# Patient Record
Sex: Female | Born: 1980 | Race: Black or African American | Hispanic: No | Marital: Single | Smoking: Current some day smoker
Health system: Southern US, Community
[De-identification: ages and names within clinical notes are randomized; demographics above are authoritative.]

## PROBLEM LIST (undated history)

## (undated) DIAGNOSIS — E119 Type 2 diabetes mellitus without complications: Secondary | ICD-10-CM

## (undated) HISTORY — DX: Type 2 diabetes mellitus without complications: E11.9

## (undated) HISTORY — PX: TUBAL LIGATION: SHX77

---

## 2001-02-18 ENCOUNTER — Ambulatory Visit (HOSPITAL_COMMUNITY): Admission: RE | Admit: 2001-02-18 | Discharge: 2001-02-18 | Payer: Self-pay | Admitting: *Deleted

## 2001-07-14 ENCOUNTER — Inpatient Hospital Stay (HOSPITAL_COMMUNITY): Admission: AD | Admit: 2001-07-14 | Discharge: 2001-07-14 | Payer: Self-pay | Admitting: *Deleted

## 2001-07-15 ENCOUNTER — Inpatient Hospital Stay (HOSPITAL_COMMUNITY): Admission: AD | Admit: 2001-07-15 | Discharge: 2001-07-18 | Payer: Self-pay | Admitting: Obstetrics & Gynecology

## 2001-12-14 ENCOUNTER — Emergency Department (HOSPITAL_COMMUNITY): Admission: EM | Admit: 2001-12-14 | Discharge: 2001-12-14 | Payer: Self-pay | Admitting: Emergency Medicine

## 2002-06-28 ENCOUNTER — Emergency Department (HOSPITAL_COMMUNITY): Admission: EM | Admit: 2002-06-28 | Discharge: 2002-06-28 | Payer: Self-pay | Admitting: *Deleted

## 2002-06-28 ENCOUNTER — Encounter: Payer: Self-pay | Admitting: *Deleted

## 2003-05-13 ENCOUNTER — Emergency Department (HOSPITAL_COMMUNITY): Admission: EM | Admit: 2003-05-13 | Discharge: 2003-05-13 | Payer: Self-pay | Admitting: Emergency Medicine

## 2003-05-13 ENCOUNTER — Encounter: Payer: Self-pay | Admitting: Emergency Medicine

## 2003-05-27 ENCOUNTER — Emergency Department (HOSPITAL_COMMUNITY): Admission: EM | Admit: 2003-05-27 | Discharge: 2003-05-27 | Payer: Self-pay | Admitting: Emergency Medicine

## 2003-08-02 ENCOUNTER — Inpatient Hospital Stay (HOSPITAL_COMMUNITY): Admission: AD | Admit: 2003-08-02 | Discharge: 2003-08-02 | Payer: Self-pay | Admitting: Obstetrics and Gynecology

## 2004-08-31 ENCOUNTER — Inpatient Hospital Stay (HOSPITAL_COMMUNITY): Admission: AD | Admit: 2004-08-31 | Discharge: 2004-08-31 | Payer: Self-pay | Admitting: Obstetrics and Gynecology

## 2004-12-04 ENCOUNTER — Ambulatory Visit (HOSPITAL_COMMUNITY): Admission: RE | Admit: 2004-12-04 | Discharge: 2004-12-04 | Payer: Self-pay | Admitting: *Deleted

## 2005-01-24 ENCOUNTER — Inpatient Hospital Stay (HOSPITAL_COMMUNITY): Admission: AD | Admit: 2005-01-24 | Discharge: 2005-01-24 | Payer: Self-pay | Admitting: *Deleted

## 2005-02-21 ENCOUNTER — Inpatient Hospital Stay (HOSPITAL_COMMUNITY): Admission: AD | Admit: 2005-02-21 | Discharge: 2005-02-21 | Payer: Self-pay | Admitting: Obstetrics & Gynecology

## 2005-02-21 ENCOUNTER — Ambulatory Visit: Payer: Self-pay | Admitting: Obstetrics and Gynecology

## 2005-02-25 ENCOUNTER — Inpatient Hospital Stay: Payer: Self-pay | Admitting: Unknown Physician Specialty

## 2005-04-29 ENCOUNTER — Emergency Department (HOSPITAL_COMMUNITY): Admission: EM | Admit: 2005-04-29 | Discharge: 2005-04-29 | Payer: Self-pay | Admitting: Family Medicine

## 2006-01-09 ENCOUNTER — Emergency Department (HOSPITAL_COMMUNITY): Admission: EM | Admit: 2006-01-09 | Discharge: 2006-01-09 | Payer: Self-pay | Admitting: Emergency Medicine

## 2006-06-09 ENCOUNTER — Emergency Department: Payer: Self-pay | Admitting: Emergency Medicine

## 2006-06-15 ENCOUNTER — Emergency Department: Payer: Self-pay | Admitting: Emergency Medicine

## 2006-07-07 ENCOUNTER — Emergency Department: Payer: Self-pay | Admitting: Emergency Medicine

## 2006-12-07 ENCOUNTER — Emergency Department: Payer: Self-pay | Admitting: Emergency Medicine

## 2007-05-06 ENCOUNTER — Emergency Department: Payer: Self-pay | Admitting: Emergency Medicine

## 2007-06-01 ENCOUNTER — Emergency Department (HOSPITAL_COMMUNITY): Admission: EM | Admit: 2007-06-01 | Discharge: 2007-06-01 | Payer: Self-pay | Admitting: *Deleted

## 2008-03-19 ENCOUNTER — Emergency Department: Payer: Self-pay | Admitting: Emergency Medicine

## 2009-09-10 ENCOUNTER — Emergency Department: Payer: Self-pay | Admitting: Emergency Medicine

## 2009-10-30 ENCOUNTER — Emergency Department: Payer: Self-pay | Admitting: Emergency Medicine

## 2011-06-06 ENCOUNTER — Emergency Department: Payer: Self-pay | Admitting: Emergency Medicine

## 2011-06-12 ENCOUNTER — Emergency Department: Payer: Self-pay | Admitting: Emergency Medicine

## 2011-08-18 LAB — URINALYSIS, ROUTINE W REFLEX MICROSCOPIC
Bilirubin Urine: NEGATIVE
Glucose, UA: NEGATIVE
Hgb urine dipstick: NEGATIVE
Ketones, ur: NEGATIVE
Nitrite: NEGATIVE
Protein, ur: NEGATIVE
Specific Gravity, Urine: 1.014
Urobilinogen, UA: 0.2
pH: 6.5

## 2011-08-18 LAB — WET PREP, GENITAL
Clue Cells Wet Prep HPF POC: NONE SEEN
Trich, Wet Prep: NONE SEEN
Yeast Wet Prep HPF POC: NONE SEEN

## 2011-08-18 LAB — GC/CHLAMYDIA PROBE AMP, GENITAL
Chlamydia, DNA Probe: NEGATIVE
GC Probe Amp, Genital: NEGATIVE

## 2011-08-18 LAB — POCT PREGNANCY, URINE
Operator id: 234501
Preg Test, Ur: NEGATIVE

## 2011-08-18 LAB — URINE MICROSCOPIC-ADD ON

## 2013-10-30 ENCOUNTER — Emergency Department: Payer: Self-pay | Admitting: Emergency Medicine

## 2014-04-27 ENCOUNTER — Emergency Department: Payer: Self-pay | Admitting: Internal Medicine

## 2014-04-27 LAB — GC/CHLAMYDIA PROBE AMP

## 2014-04-27 LAB — WET PREP, GENITAL

## 2014-04-29 LAB — BETA STREP CULTURE(ARMC)

## 2014-06-06 ENCOUNTER — Emergency Department: Payer: Self-pay | Admitting: Emergency Medicine

## 2016-01-28 ENCOUNTER — Emergency Department
Admission: EM | Admit: 2016-01-28 | Discharge: 2016-01-28 | Disposition: A | Discharge status: Home / Self Care | Payer: Self-pay | Attending: Emergency Medicine | Admitting: Emergency Medicine

## 2016-01-28 ENCOUNTER — Emergency Department: Payer: Self-pay

## 2016-01-28 ENCOUNTER — Encounter: Payer: Self-pay | Admitting: Emergency Medicine

## 2016-01-28 DIAGNOSIS — J111 Influenza due to unidentified influenza virus with other respiratory manifestations: Secondary | ICD-10-CM | POA: Insufficient documentation

## 2016-01-28 DIAGNOSIS — F17210 Nicotine dependence, cigarettes, uncomplicated: Secondary | ICD-10-CM | POA: Insufficient documentation

## 2016-01-28 DIAGNOSIS — F1490 Cocaine use, unspecified, uncomplicated: Secondary | ICD-10-CM | POA: Insufficient documentation

## 2016-01-28 LAB — URINALYSIS COMPLETE WITH MICROSCOPIC (ARMC ONLY)
BILIRUBIN URINE: NEGATIVE
Bacteria, UA: NONE SEEN
Glucose, UA: 50 mg/dL — AB
Hgb urine dipstick: NEGATIVE
KETONES UR: NEGATIVE mg/dL
Leukocytes, UA: NEGATIVE
Nitrite: NEGATIVE
PROTEIN: 30 mg/dL — AB
Specific Gravity, Urine: 1.01 (ref 1.005–1.030)
pH: 5 (ref 5.0–8.0)

## 2016-01-28 LAB — GLUCOSE, CAPILLARY: Glucose-Capillary: 83 mg/dL (ref 65–99)

## 2016-01-28 MED ORDER — IBUPROFEN 800 MG PO TABS
800.0000 mg | ORAL_TABLET | Freq: Once | ORAL | Status: AC
  Administered 2016-01-28: 800 mg via ORAL
  Filled 2016-01-28: qty 1

## 2016-01-28 MED ORDER — IBUPROFEN 600 MG PO TABS: 600.0000 mg | ORAL_TABLET | Freq: Four times a day (QID) | ORAL | Status: DC | PRN

## 2016-01-28 MED ORDER — OSELTAMIVIR PHOSPHATE 75 MG PO CAPS: 75.0000 mg | ORAL_CAPSULE | Freq: Two times a day (BID) | ORAL | Status: AC

## 2016-01-28 MED ORDER — ACETAMINOPHEN-CODEINE #3 300-30 MG PO TABS: 1.0000 | ORAL_TABLET | ORAL | Status: DC | PRN

## 2016-01-28 NOTE — ED Notes (Signed)
Pt with clear lung sounds bilateral. A/o. Tearful. 0/10 pain when not moving or coughing. Left flank tender to palpation.

## 2016-01-28 NOTE — ED Provider Notes (Signed)
[facility] Emergency Department Provider Note  ____________________________________________  Time seen: Approximately 5:09 PM  I have reviewed the triage vital signs and the nursing notes.   HISTORY  Chief Complaint Flu like symptoms  and Flank Pain   HPI Christina Cochran is a 35 y.o. female who presents to the emergency department for evaluation of left mid back pain, chills, and cough. She has not taken anything for pain today. She states that the pain in her back started while sleeping and awakened her when she rolled over. She states the cough started before the back pain. She denies injury. She denies dysuria or fever.    History reviewed. No pertinent past medical history.  There are no active problems to display for this patient.   Past Surgical History  Procedure Laterality Date  . Cesarean section      Current Outpatient Rx  Name  Route  Sig  Dispense  Refill  . acetaminophen-codeine (TYLENOL #3) 300-30 MG tablet   Oral   Take 1-2 tablets by mouth every 4 (four) hours as needed for moderate pain.   12 tablet   0   . ibuprofen (ADVIL,MOTRIN) 600 MG tablet   Oral   Take 1 tablet (600 mg total) by mouth every 6 (six) hours as needed.   30 tablet   0   . oseltamivir (TAMIFLU) 75 MG capsule   Oral   Take 1 capsule (75 mg total) by mouth 2 (two) times daily.   10 capsule   0     Allergies Review of patient's allergies indicates no known allergies.  No family history on file.  Social History Social History  Substance Use Topics  . Smoking status: Current Every Day Smoker -- 0.50 packs/day    Types: Cigarettes  . Smokeless tobacco: None  . Alcohol Use: Yes    Review of Systems Constitutional: No fever/chills Eyes: No visual changes. ENT: No sore throat. Cardiovascular: Denies chest pain. Respiratory: She denies shortness of breath. Positive for cough. Gastrointestinal: Negative for abdominal pain. No nausea,  No vomiting.   No diarrhea.  Genitourinary: Negative for dysuria. Musculoskeletal: Positive for left mid back pain body aches Skin: Negative for rash. Neurological: Negative for headaches, Negative for focal weakness or numbness. ____________________________________________   PHYSICAL EXAM:  VITAL SIGNS: ED Triage Vitals  Enc Vitals Group     BP 01/28/16 1513 133/82 mmHg     Pulse Rate 01/28/16 1513 96     Resp 01/28/16 1513 18     Temp 01/28/16 1513 99.6 F (37.6 C)     Temp Source 01/28/16 1513 Oral     SpO2 01/28/16 1513 97 %     Weight 01/28/16 1513 145 lb (65.772 kg)     Height 01/28/16 1513 5\' 4"  (1.626 m)     Head Cir --      Peak Flow --      Pain Score 01/28/16 1513 8     Pain Loc --      Pain Edu? --      Excl. in GC? --     Constitutional: Alert and oriented. Well appearing and in no acute distress. Eyes: Conjunctivae are normal. PERRL. EOMI. Ears: TM normal Head: Atraumatic. Nose: No congestion/rhinnorhea. Mouth/Throat: Mucous membranes are moist.  Oropharynx non erythematous. Neck: No stridor.  Lymphatic: No cervical lymphadenopathy. Cardiovascular: Normal rate, regular rhythm. Grossly normal heart sounds.  Good peripheral circulation. Respiratory: Normal respiratory effort.  No retractions. Clear throughout. Gastrointestinal: Soft  and nontender. No distention. No abdominal bruits. No CVA tenderness. Musculoskeletal: No joint pain reported. Right flank pain with light touch or movement. Neurologic:  Normal speech and language. No gross focal neurologic deficits are appreciated. Speech is normal. No gait instability. Skin:  Skin is warm, dry and intact. No rash noted. Psychiatric: Mood and affect are normal. Speech and behavior are normal.  ____________________________________________   LABS (all labs ordered are listed, but only abnormal results are displayed)  Labs Reviewed  URINALYSIS COMPLETEWITH MICROSCOPIC ([facility] ONLY) - Abnormal; Notable for the following:     Color, Urine STRAW (*)    APPearance CLEAR (*)    Glucose, UA 50 (*)    Protein, ur 30 (*)    Squamous Epithelial / LPF 0-5 (*)    All other components within normal limits  GLUCOSE, CAPILLARY  CBG MONITORING, ED   ____________________________________________  EKG   ____________________________________________  RADIOLOGY  No acute pulmonary abnormality per radiology. ____________________________________________   PROCEDURES  Procedure(s) performed: None  Critical Care performed: No  ____________________________________________   INITIAL IMPRESSION / ASSESSMENT AND PLAN / ED COURSE  Pertinent labs & imaging results that were available during my care of the patient were reviewed by me and considered in my medical decision making (see chart for details).   Patient was advised to follow up with the primary care provider for symptoms that are not improving over the next 5-7 days. She was advised to return to the emergency department for symptoms that change or worsen if unable to schedule an appointment with the primary care provider. ____________________________________________   FINAL CLINICAL IMPRESSION(S) / ED DIAGNOSES  Final diagnoses:  Influenza       Chinita Pester, FNP 01/28/16 2006  Loleta Rose, MD 01/28/16 2351
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
## 2016-01-28 NOTE — Discharge Instructions (Signed)

## 2016-02-13 ENCOUNTER — Emergency Department: Payer: Self-pay

## 2016-02-13 ENCOUNTER — Emergency Department
Admission: EM | Admit: 2016-02-13 | Discharge: 2016-02-13 | Disposition: A | Discharge status: Home / Self Care | Payer: Self-pay | Attending: Student | Admitting: Student

## 2016-02-13 ENCOUNTER — Encounter: Payer: Self-pay | Admitting: *Deleted

## 2016-02-13 DIAGNOSIS — F17210 Nicotine dependence, cigarettes, uncomplicated: Secondary | ICD-10-CM | POA: Insufficient documentation

## 2016-02-13 DIAGNOSIS — R42 Dizziness and giddiness: Secondary | ICD-10-CM | POA: Insufficient documentation

## 2016-02-13 LAB — URINALYSIS COMPLETE WITH MICROSCOPIC (ARMC ONLY)
Bilirubin Urine: NEGATIVE
GLUCOSE, UA: NEGATIVE mg/dL
HGB URINE DIPSTICK: NEGATIVE
Ketones, ur: NEGATIVE mg/dL
LEUKOCYTES UA: NEGATIVE
Nitrite: NEGATIVE
Protein, ur: NEGATIVE mg/dL
Specific Gravity, Urine: 1.015 (ref 1.005–1.030)
pH: 5 (ref 5.0–8.0)

## 2016-02-13 LAB — URINE DRUG SCREEN, QUALITATIVE (ARMC ONLY)
AMPHETAMINES, UR SCREEN: NOT DETECTED
BARBITURATES, UR SCREEN: NOT DETECTED
BENZODIAZEPINE, UR SCRN: NOT DETECTED
COCAINE METABOLITE, UR ~~LOC~~: POSITIVE — AB
Cannabinoid 50 Ng, Ur ~~LOC~~: POSITIVE — AB
MDMA (Ecstasy)Ur Screen: NOT DETECTED
METHADONE SCREEN, URINE: NOT DETECTED
Opiate, Ur Screen: NOT DETECTED
Phencyclidine (PCP) Ur S: NOT DETECTED
TRICYCLIC, UR SCREEN: NOT DETECTED

## 2016-02-13 LAB — CBC WITH DIFFERENTIAL/PLATELET
BASOS PCT: 1 %
Basophils Absolute: 0 10*3/uL (ref 0–0.1)
EOS ABS: 0 10*3/uL (ref 0–0.7)
Eosinophils Relative: 1 %
HEMATOCRIT: 37.8 % (ref 35.0–47.0)
HEMOGLOBIN: 13 g/dL (ref 12.0–16.0)
Lymphocytes Relative: 21 %
Lymphs Abs: 1 10*3/uL (ref 1.0–3.6)
MCH: 30.9 pg (ref 26.0–34.0)
MCHC: 34.5 g/dL (ref 32.0–36.0)
MCV: 89.5 fL (ref 80.0–100.0)
MONOS PCT: 16 %
Monocytes Absolute: 0.8 10*3/uL (ref 0.2–0.9)
NEUTROS PCT: 61 %
Neutro Abs: 3 10*3/uL (ref 1.4–6.5)
Platelets: 237 10*3/uL (ref 150–440)
RBC: 4.22 MIL/uL (ref 3.80–5.20)
RDW: 13.3 % (ref 11.5–14.5)
WBC: 4.9 10*3/uL (ref 3.6–11.0)

## 2016-02-13 LAB — COMPREHENSIVE METABOLIC PANEL
ALBUMIN: 4.1 g/dL (ref 3.5–5.0)
ALK PHOS: 46 U/L (ref 38–126)
ALT: 17 U/L (ref 14–54)
AST: 24 U/L (ref 15–41)
Anion gap: 10 (ref 5–15)
BILIRUBIN TOTAL: 0.6 mg/dL (ref 0.3–1.2)
BUN: 15 mg/dL (ref 6–20)
CALCIUM: 9.4 mg/dL (ref 8.9–10.3)
CO2: 23 mmol/L (ref 22–32)
CREATININE: 0.75 mg/dL (ref 0.44–1.00)
Chloride: 105 mmol/L (ref 101–111)
GFR calc Af Amer: >60 mL/min (ref >60)
GFR calc non Af Amer: >60 mL/min (ref >60)
GLUCOSE: 82 mg/dL (ref 65–99)
Potassium: 3.8 mmol/L (ref 3.5–5.1)
SODIUM: 138 mmol/L (ref 135–145)
Total Protein: 7.7 g/dL (ref 6.5–8.1)

## 2016-02-13 LAB — POCT PREGNANCY, URINE: PREG TEST UR: NEGATIVE

## 2016-02-13 LAB — ETHANOL: Alcohol, Ethyl (B): 51 mg/dL — ABNORMAL HIGH (ref <5)

## 2016-02-13 MED ORDER — VITAMIN B-1 100 MG PO TABS
100.0000 mg | ORAL_TABLET | Freq: Once | ORAL | Status: AC
  Administered 2016-02-13: 100 mg via ORAL
  Filled 2016-02-13: qty 1

## 2016-02-13 MED ORDER — FOLIC ACID 1 MG PO TABS
1.0000 mg | ORAL_TABLET | Freq: Once | ORAL | Status: AC
  Administered 2016-02-13: 1 mg via ORAL
  Filled 2016-02-13: qty 1

## 2016-02-13 MED ORDER — GADOBENATE DIMEGLUMINE 529 MG/ML IV SOLN
15.0000 mL | Freq: Once | INTRAVENOUS | Status: AC | PRN
  Administered 2016-02-13: 13 mL via INTRAVENOUS

## 2016-02-13 MED ORDER — SODIUM CHLORIDE 0.9 % IV BOLUS (SEPSIS)
1000.0000 mL | Freq: Once | INTRAVENOUS | Status: AC
  Administered 2016-02-13: 1000 mL via INTRAVENOUS

## 2016-02-13 NOTE — ED Notes (Signed)
Pt arrives with complaints of "being off balance" states she was diagnosed with the flu 2 weeks ago and was at the grocery store and felt dizzy and states since then she is off balance, denies any pain, states she feels like a bug has been crawling in the left ear, states she drinks 2 40 oz beers daily and uses cocaine regularly, last used 2 days ago

## 2016-02-13 NOTE — ED Notes (Signed)
Patient transported to MRI 

## 2016-02-13 NOTE — ED Provider Notes (Signed)
[facility] Emergency Department Provider Note  ____________________________________________  Time seen: Approximately 11:15 AM  I have reviewed the triage vital signs and the nursing notes.   HISTORY  Chief Complaint Dizziness    HPI Christina Cochran is a 35 y.o. female with of alcoholism, polysubstance abuse including crack cocaine who presents for evaluation of factly 10 days of intermittent dizziness, feeling off balance, lightheadedness which is worse with standing and walking, gradual onset, intermittent, currently moderate. She was seen in this emergency department on 01/28/2016. She was diagnosed with influenza, discharge with Tamiflu however she was not able to fill her prescription secondary to cost. She reports over the past couple of days she has had sensation of being off balance when she walks. She denies any chest pain but she has had persistent cough. No fevers. No vomiting or diarrhea. No dysuria. She drinks at least 2x 40 ounces per day and last smoked crack cocaine a few days ago. She denies any IV drug use.   History reviewed. No pertinent past medical history.  There are no active problems to display for this patient.   Past Surgical History  Procedure Laterality Date  . Cesarean section      Current Outpatient Rx  Name  Route  Sig  Dispense  Refill  . acetaminophen-codeine (TYLENOL #3) 300-30 MG tablet   Oral   Take 1-2 tablets by mouth every 4 (four) hours as needed for moderate pain.   12 tablet   0   . ibuprofen (ADVIL,MOTRIN) 600 MG tablet   Oral   Take 1 tablet (600 mg total) by mouth every 6 (six) hours as needed.   30 tablet   0     Allergies Review of patient's allergies indicates no known allergies.  History reviewed. No pertinent family history.  Social History Social History  Substance Use Topics  . Smoking status: Current Every Day Smoker -- 0.50 packs/day    Types: Cigarettes  . Smokeless tobacco: None  .  Alcohol Use: Yes    Review of Systems Constitutional: No fever/chills Eyes: No visual changes. ENT: No sore throat. Cardiovascular: Denies chest pain. Respiratory: Denies shortness of breath. Gastrointestinal: No abdominal pain.  No nausea, no vomiting.  No diarrhea.  No constipation. Genitourinary: Negative for dysuria. Musculoskeletal: Negative for back pain. Skin: Negative for rash. Neurological: Negative for headaches, focal weakness or numbness.  10-point ROS otherwise negative.  ____________________________________________   PHYSICAL EXAM:  VITAL SIGNS: ED Triage Vitals  Enc Vitals Group     BP 02/13/16 1111 118/76 mmHg     Pulse Rate 02/13/16 1111 74     Resp 02/13/16 1111 18     Temp 02/13/16 1111 98.1 F (36.7 C)     Temp Source 02/13/16 1111 Oral     SpO2 02/13/16 1111 100 %     Weight 02/13/16 1111 140 lb (63.504 kg)     Height 02/13/16 1111  (1.626 m)     Head Cir --      Peak Flow --      Pain Score 02/13/16 1112 0     Pain Loc --      Pain Edu? --      Excl. in GC? --     Constitutional: Alert and oriented. Well appearing and in no acute distress. Eyes: Conjunctivae are normal. PERRL. EOMI. Head: Atraumatic. Nose: No congestion/rhinnorhea. Mouth/Throat: Mucous membranes are moist.  Oropharynx non-erythematous. Neck: No stridor. Supple without meningismus. Cardiovascular: Normal rate,  regular rhythm. Grossly normal heart sounds.  Good peripheral circulation. Respiratory: Normal respiratory effort.  No retractions. Lungs CTAB. Gastrointestinal: Soft and nontender. No distention. No CVA tenderness. Genitourinary: deferred Musculoskeletal: No lower extremity tenderness nor edema.  No joint effusions. Neurologic:  Normal speech and language. 5 out of 5 strength in bilateral upper and lower external me, sensation intact to light touch throughout, cranial nerves II through XII intact. Normal finger-nose-finger, normal heel shin though this does take  quite a bit of concentration. She is able to tandem walk but has some difficulty. Normal ambulation otherwise. Skin:  Skin is warm, dry and intact. No rash noted. Psychiatric: Mood and affect are normal. Speech and behavior are normal.  ____________________________________________   LABS (all labs ordered are listed, but only abnormal results are displayed)  Labs Reviewed  URINALYSIS COMPLETEWITH MICROSCOPIC ([facility] ONLY) - Abnormal; Notable for the following:    Color, Urine YELLOW (*)    APPearance CLEAR (*)    Bacteria, UA RARE (*)    Squamous Epithelial / LPF 0-5 (*)    All other components within normal limits  URINE DRUG SCREEN, QUALITATIVE ([facility] ONLY) - Abnormal; Notable for the following:    Cocaine Metabolite,Ur [place] POSITIVE (*)    Cannabinoid 50 Ng, Ur  POSITIVE (*)    All other components within normal limits  ETHANOL - Abnormal; Notable for the following:    Alcohol, Ethyl (B) 51 (*)    All other components within normal limits  CBC WITH DIFFERENTIAL/PLATELET  COMPREHENSIVE METABOLIC PANEL  POC URINE PREG, ED  POCT PREGNANCY, URINE   ____________________________________________  EKG  ED ECG REPORT I, Gayla Doss, the attending physician, personally viewed and interpreted this ECG.   Date: 02/13/2016  EKG Time: 11:25  Rate: 84  Rhythm: normal sinus rhythm  Axis: normal  Intervals:none  ST&T Change: No STEMI. Mild J-point elevation V4, V5, V6 likely benign early repolarization.  ____________________________________________  RADIOLOGY  MRI brain with and without contrast IMPRESSION: Normal examination. No abnormality seen to explain the presenting symptoms.   CXR IMPRESSION: No active cardiopulmonary disease.  ____________________________________________   PROCEDURES  Procedure(s) performed: None  Critical Care performed: No  ____________________________________________   INITIAL IMPRESSION / ASSESSMENT AND PLAN / ED COURSE  Pertinent  labs & imaging results that were available during my care of the patient were reviewed by me and considered in my medical decision making (see chart for details).  Christina Cochran is a 35 y.o. female with of alcoholism, polysubstance abuse including crack cocaine who presents for evaluation of factly 10 days of intermittent dizziness, feeling off balance, lightheadedness which is worse with standing and walking. On exam, she is nontoxic appearing and in no acute distress. Vital signs stable, she is afebrile. She is subjectively lightheaded when she stands up and has some difficulty with tandem walk. We'll obtain screening labs, chest x-ray and MRI of the brain with and without contrast to rule out posterior circulation stroke, evaluate for any evidence of septic emboli as she is risk given known drug abuse. We'll give IV fluids. Reassess for disposition.  ----------------------------------------- 2:42 PM on 02/13/2016 ----------------------------------------- She reports significant improvement of her symptoms at this time. She is able to ambulate normally. MRI of her brain is normal. Chest x-ray clear. Labs reviewed. CBC, CMP unremarkable, negative pregnancy test. Urinalysis is not consistent with infection. Ethanol level elevated at 51. Urine drug screen positive for cannabis and cocaine. We discussed need for adequate hydration, cessation of alcohol  and drugs and need for close PCP follow-up and she is comfortable with the discharge plan. DC home. ____________________________________________   FINAL CLINICAL IMPRESSION(S) / ED DIAGNOSES  Final diagnoses:  Dizziness      Gayla DossEryka A Nima Bamburg, MD 02/13/16 1446

## 2016-09-16 ENCOUNTER — Emergency Department
Admission: EM | Admit: 2016-09-16 | Discharge: 2016-09-16 | Disposition: A | Discharge status: Home / Self Care | Payer: Self-pay | Attending: Emergency Medicine | Admitting: Emergency Medicine

## 2016-09-16 DIAGNOSIS — Z5181 Encounter for therapeutic drug level monitoring: Secondary | ICD-10-CM | POA: Insufficient documentation

## 2016-09-16 DIAGNOSIS — F17210 Nicotine dependence, cigarettes, uncomplicated: Secondary | ICD-10-CM | POA: Insufficient documentation

## 2016-09-16 DIAGNOSIS — Y939 Activity, unspecified: Secondary | ICD-10-CM | POA: Insufficient documentation

## 2016-09-16 DIAGNOSIS — Y999 Unspecified external cause status: Secondary | ICD-10-CM | POA: Insufficient documentation

## 2016-09-16 DIAGNOSIS — F1910 Other psychoactive substance abuse, uncomplicated: Secondary | ICD-10-CM | POA: Insufficient documentation

## 2016-09-16 DIAGNOSIS — F10120 Alcohol abuse with intoxication, uncomplicated: Secondary | ICD-10-CM | POA: Insufficient documentation

## 2016-09-16 DIAGNOSIS — X781XXA Intentional self-harm by knife, initial encounter: Secondary | ICD-10-CM | POA: Insufficient documentation

## 2016-09-16 DIAGNOSIS — F10920 Alcohol use, unspecified with intoxication, uncomplicated: Secondary | ICD-10-CM

## 2016-09-16 DIAGNOSIS — S51811A Laceration without foreign body of right forearm, initial encounter: Secondary | ICD-10-CM | POA: Insufficient documentation

## 2016-09-16 DIAGNOSIS — Y929 Unspecified place or not applicable: Secondary | ICD-10-CM | POA: Insufficient documentation

## 2016-09-16 LAB — COMPREHENSIVE METABOLIC PANEL
ALT: 14 U/L (ref 14–54)
AST: 25 U/L (ref 15–41)
Albumin: 4.6 g/dL (ref 3.5–5.0)
Alkaline Phosphatase: 56 U/L (ref 38–126)
Anion gap: 9 (ref 5–15)
BILIRUBIN TOTAL: 0.6 mg/dL (ref 0.3–1.2)
BUN: 12 mg/dL (ref 6–20)
CHLORIDE: 103 mmol/L (ref 101–111)
CO2: 25 mmol/L (ref 22–32)
CREATININE: 0.7 mg/dL (ref 0.44–1.00)
Calcium: 9.4 mg/dL (ref 8.9–10.3)
Glucose, Bld: 78 mg/dL (ref 65–99)
POTASSIUM: 3.7 mmol/L (ref 3.5–5.1)
Sodium: 137 mmol/L (ref 135–145)
TOTAL PROTEIN: 8.6 g/dL — AB (ref 6.5–8.1)

## 2016-09-16 LAB — CBC WITH DIFFERENTIAL/PLATELET
BASOS ABS: 0 10*3/uL (ref 0–0.1)
Basophils Relative: 1 %
EOS ABS: 0.1 10*3/uL (ref 0–0.7)
EOS PCT: 2 %
HCT: 43.2 % (ref 35.0–47.0)
HEMOGLOBIN: 14.7 g/dL (ref 12.0–16.0)
LYMPHS ABS: 1.3 10*3/uL (ref 1.0–3.6)
LYMPHS PCT: 23 %
MCH: 31.3 pg (ref 26.0–34.0)
MCHC: 34 g/dL (ref 32.0–36.0)
MCV: 92.1 fL (ref 80.0–100.0)
Monocytes Absolute: 0.6 10*3/uL (ref 0.2–0.9)
Monocytes Relative: 10 %
NEUTROS PCT: 64 %
Neutro Abs: 3.5 10*3/uL (ref 1.4–6.5)
PLATELETS: 166 10*3/uL (ref 150–440)
RBC: 4.69 MIL/uL (ref 3.80–5.20)
RDW: 13.1 % (ref 11.5–14.5)
WBC: 5.5 10*3/uL (ref 3.6–11.0)

## 2016-09-16 LAB — URINE DRUG SCREEN, QUALITATIVE (ARMC ONLY)
Amphetamines, Ur Screen: NOT DETECTED
BARBITURATES, UR SCREEN: NOT DETECTED
BENZODIAZEPINE, UR SCRN: NOT DETECTED
CANNABINOID 50 NG, UR ~~LOC~~: NOT DETECTED
Cocaine Metabolite,Ur ~~LOC~~: POSITIVE — AB
MDMA (Ecstasy)Ur Screen: NOT DETECTED
Methadone Scn, Ur: NOT DETECTED
OPIATE, UR SCREEN: NOT DETECTED
PHENCYCLIDINE (PCP) UR S: NOT DETECTED
Tricyclic, Ur Screen: NOT DETECTED

## 2016-09-16 LAB — ETHANOL: ALCOHOL ETHYL (B): 50 mg/dL — AB (ref <5)

## 2016-09-16 LAB — URINALYSIS COMPLETE WITH MICROSCOPIC (ARMC ONLY)
BILIRUBIN URINE: NEGATIVE
Glucose, UA: NEGATIVE mg/dL
HGB URINE DIPSTICK: NEGATIVE
KETONES UR: NEGATIVE mg/dL
NITRITE: NEGATIVE
PH: 5 (ref 5.0–8.0)
PROTEIN: NEGATIVE mg/dL
SPECIFIC GRAVITY, URINE: 1.005 (ref 1.005–1.030)

## 2016-09-16 LAB — PREGNANCY, URINE: Preg Test, Ur: NEGATIVE

## 2016-09-16 MED ORDER — LIDOCAINE-EPINEPHRINE (PF) 1 %-1:200000 IJ SOLN
INTRAMUSCULAR | Status: AC
  Filled 2016-09-16: qty 30
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
## 2016-09-16 NOTE — Discharge Instructions (Signed)
You were evaluated for laceration which was repaired with stitches.  Stitches need to be removed in about 10 days -- at primary care doctor's office.  Return to the emergency department for any signs of infection including worsening redness, swelling, or any drainage from the wound.  I have provided follow-up contact information for primary care doctors as well as mental health/drug and alcohol abuse for [facility].

## 2016-09-16 NOTE — ED Provider Notes (Signed)
LACERATION REPAIR Performed by: Enid DerryAshley Jasmond Cochran  Consent: Verbal consent obtained.  Consent given by: patient  Prepped and Draped in normal sterile fashion  Wound explored: No foreign bodies identified  Laceration Location: Left forearm  Laceration Length: 1.5 inches  Anesthesia: None  Local anesthetic: lidocaine 1% w/ epinephrine  Anesthetic total: 4 ml  Irrigation method: syringe  Amount of cleaning: 1/2 L normal saline and iodine   Skin closure: 4-0 nylon  Number of sutures: 9  Technique: Simple interrupted  Patient tolerance: Patient tolerated the procedure well with no immediate complications.     Enid DerryAshley Christina Mcdiarmid, PA-C 09/16/16 1538    Governor Rooksebecca Lord, MD 09/16/16 [phone]
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
## 2017-08-17 IMAGING — CR DG CHEST 2V
2 series · 2 of 2 positions shown · non-contrast
Comparison: June 06, 2011.

CLINICAL DATA: Nonproductive cough.

EXAM:
CHEST  2 VIEW

[chest pa]
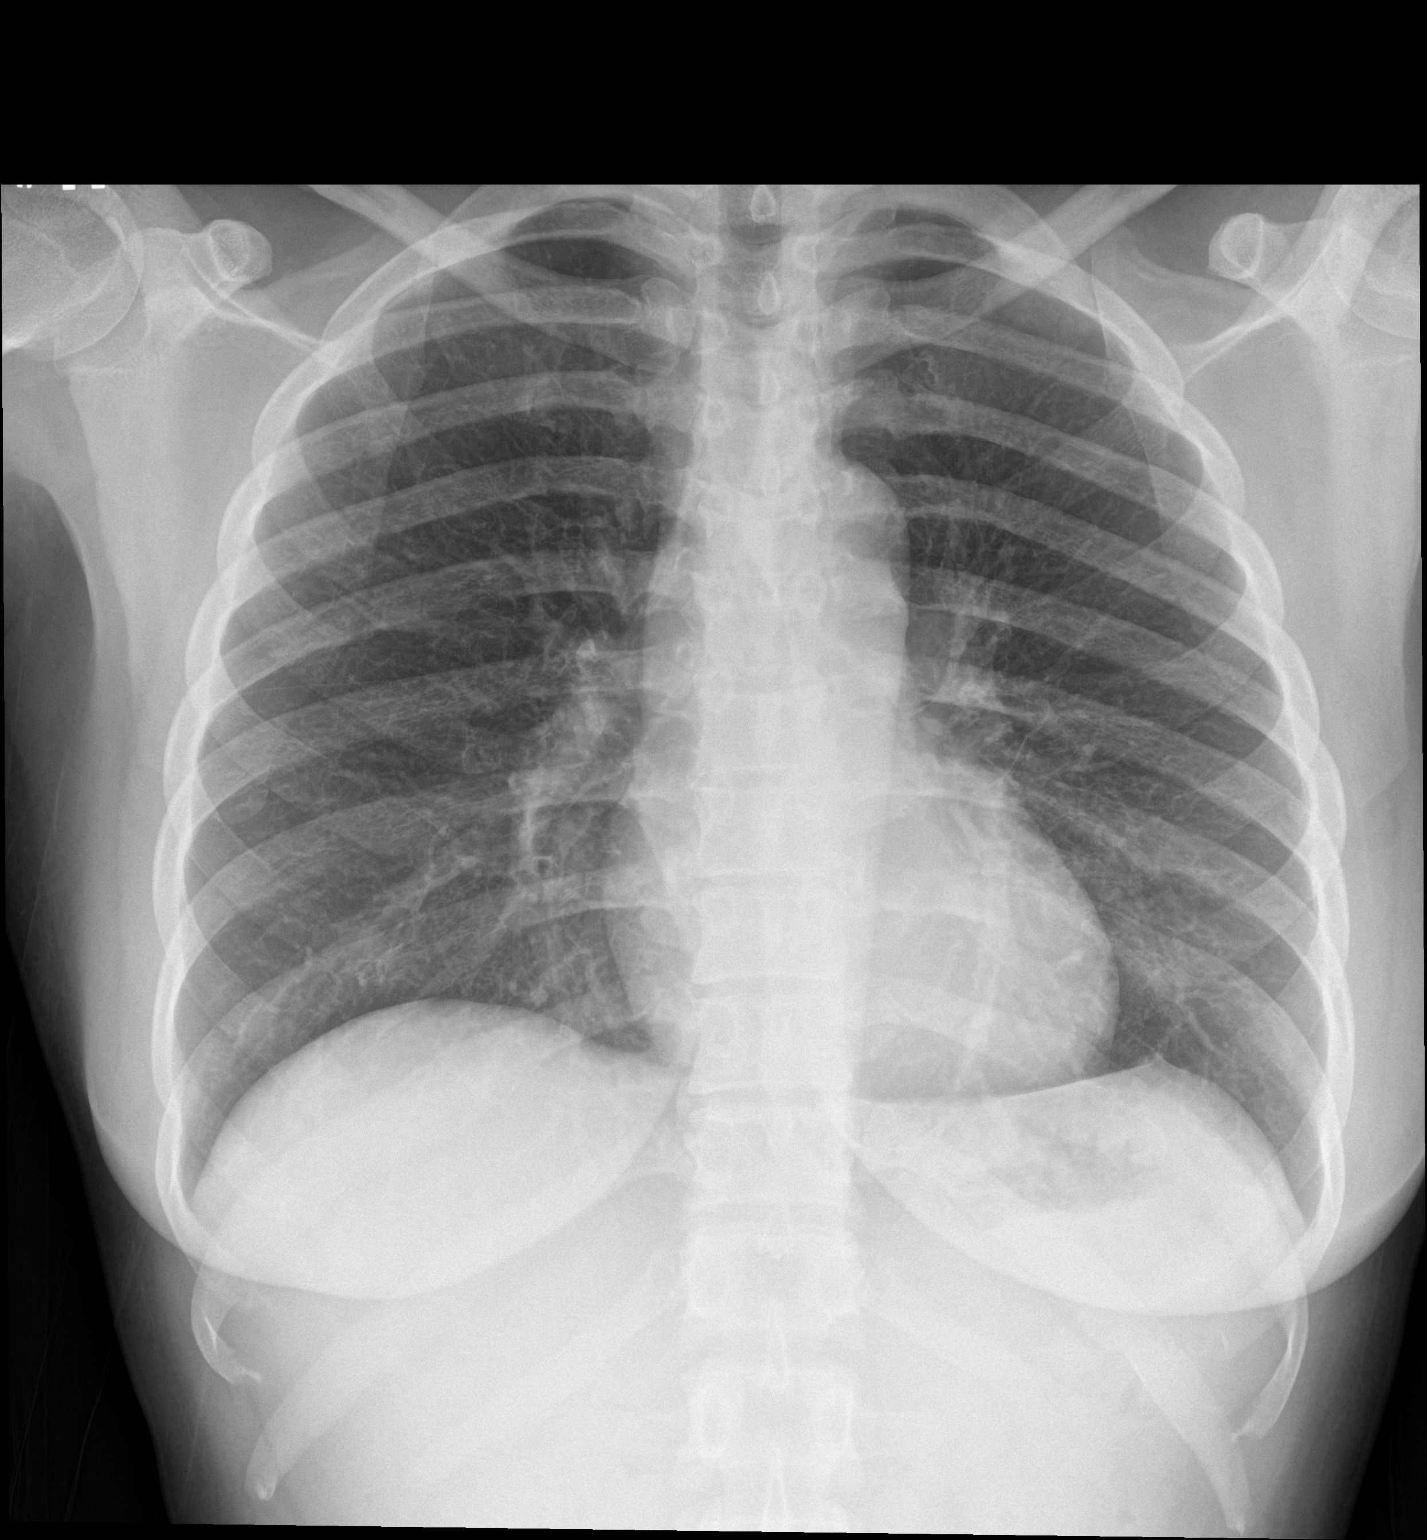

[chest lat]
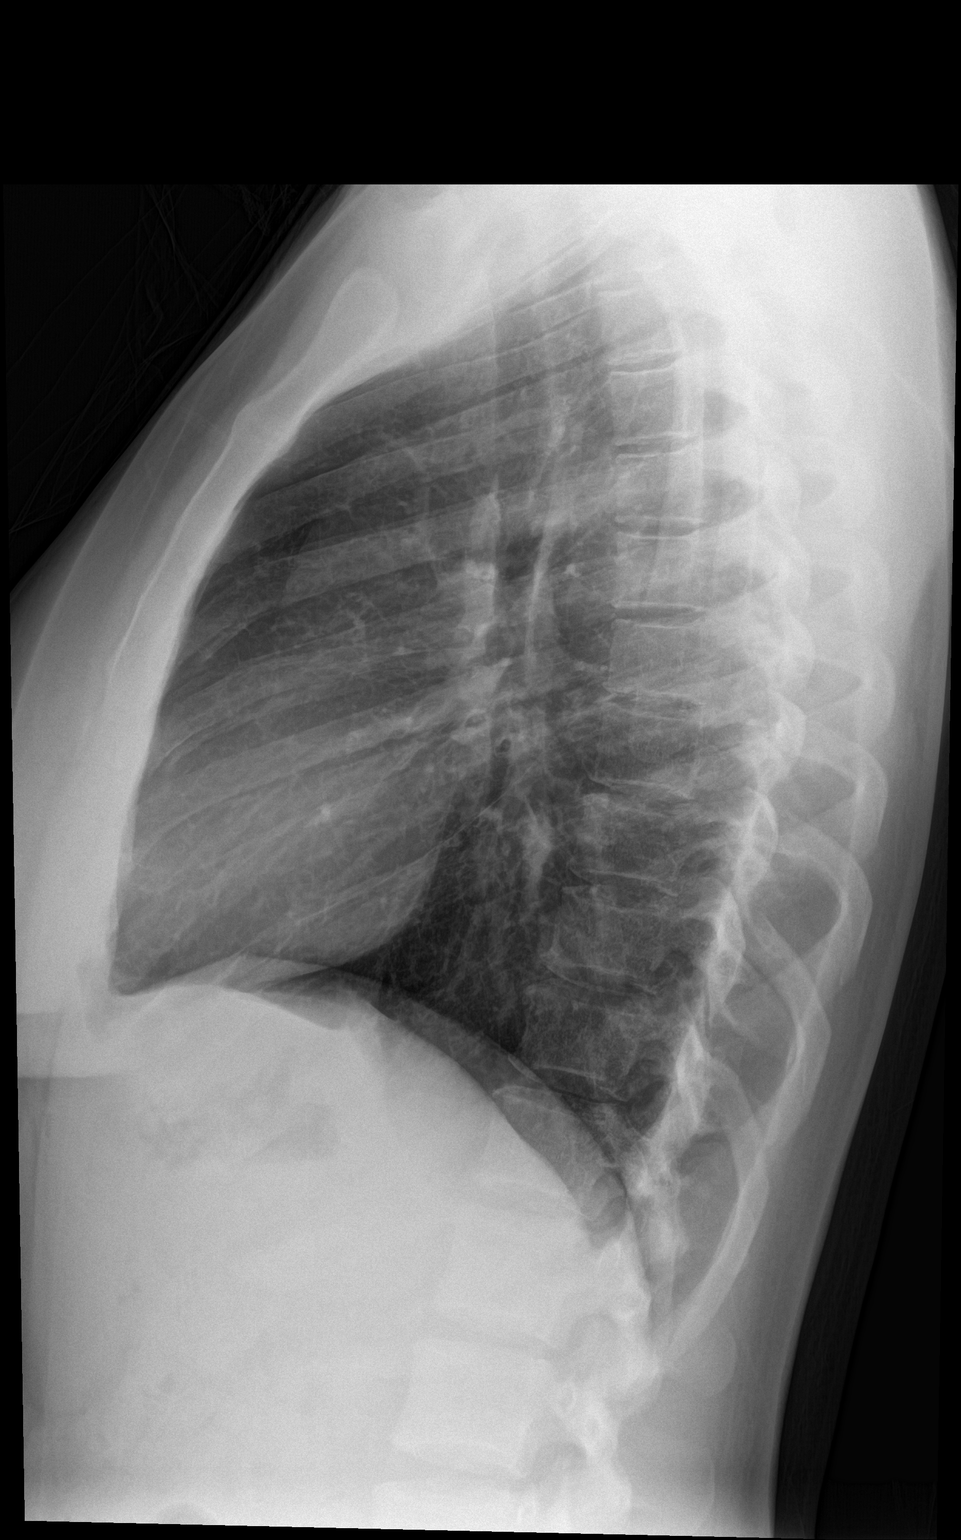

[2 of 2 positions shown; findings below may reference images not displayed]

FINDINGS: The heart size and mediastinal contours are within normal limits.
Both lungs are clear. No pneumothorax or pleural effusion is noted.
The visualized skeletal structures are unremarkable.
IMPRESSION: No active cardiopulmonary disease.

## 2018-01-02 ENCOUNTER — Encounter: Payer: Self-pay | Admitting: Emergency Medicine

## 2018-01-02 ENCOUNTER — Emergency Department
Admission: EM | Admit: 2018-01-02 | Discharge: 2018-01-02 | Disposition: A | Discharge status: Home / Self Care | Payer: Self-pay | Attending: Emergency Medicine | Admitting: Emergency Medicine

## 2018-01-02 ENCOUNTER — Other Ambulatory Visit: Payer: Self-pay

## 2018-01-02 ENCOUNTER — Emergency Department: Payer: Self-pay

## 2018-01-02 DIAGNOSIS — F17210 Nicotine dependence, cigarettes, uncomplicated: Secondary | ICD-10-CM | POA: Insufficient documentation

## 2018-01-02 DIAGNOSIS — R0789 Other chest pain: Secondary | ICD-10-CM | POA: Insufficient documentation

## 2018-01-02 LAB — BASIC METABOLIC PANEL
Anion gap: 9 (ref 5–15)
BUN: 17 mg/dL (ref 6–20)
CALCIUM: 8.8 mg/dL — AB (ref 8.9–10.3)
CO2: 23 mmol/L (ref 22–32)
CREATININE: 0.74 mg/dL (ref 0.44–1.00)
Chloride: 107 mmol/L (ref 101–111)
GFR calc Af Amer: >60 mL/min (ref >60)
Glucose, Bld: 126 mg/dL — ABNORMAL HIGH (ref 65–99)
Potassium: 3.5 mmol/L (ref 3.5–5.1)
SODIUM: 139 mmol/L (ref 135–145)

## 2018-01-02 LAB — CBC
HCT: 38.1 % (ref 35.0–47.0)
Hemoglobin: 12.9 g/dL (ref 12.0–16.0)
MCH: 31.4 pg (ref 26.0–34.0)
MCHC: 33.9 g/dL (ref 32.0–36.0)
MCV: 92.5 fL (ref 80.0–100.0)
PLATELETS: 162 10*3/uL (ref 150–440)
RBC: 4.12 MIL/uL (ref 3.80–5.20)
RDW: 13.4 % (ref 11.5–14.5)
WBC: 6 10*3/uL (ref 3.6–11.0)

## 2018-01-02 LAB — TROPONIN I

## 2018-01-02 MED ORDER — KETOROLAC TROMETHAMINE 30 MG/ML IJ SOLN
30.0000 mg | Freq: Once | INTRAMUSCULAR | Status: AC
  Administered 2018-01-02: 30 mg via INTRAVENOUS
  Filled 2018-01-02: qty 1

## 2018-01-02 MED ORDER — OXYCODONE-ACETAMINOPHEN 5-325 MG PO TABS
1.0000 | ORAL_TABLET | Freq: Once | ORAL | Status: AC
  Administered 2018-01-02: 1 via ORAL
  Filled 2018-01-02: qty 1

## 2018-01-02 NOTE — ED Triage Notes (Signed)
Patient with complaint of left chest pain that radiated into her back. Patient states that the pain started last night and has become worse throughout the night. Patient denies shortness of breath or nausea.

## 2018-01-02 NOTE — ED Notes (Signed)

## 2018-01-02 NOTE — ED Provider Notes (Signed)
[facility] Emergency Department Provider Note  ___________________________________________   First MD Initiated Contact with Patient 01/02/18 [phone]     (approximate)  I have reviewed the triage vital signs and the nursing notes.   HISTORY  Chief Complaint Chest Pain   HPI Christina Cochran is a 36 y.o. female without any chronic medical conditions who is presenting to the emergency department with left sided chest pain that started at about 9 PM last night.  She says that the pain is an 8 out of 10 at this time and feels like a muscle spasm.  Pain is to the left side of the chest and radiates through to the back but does not radiate to the arms or neck.  She says that it hurts when she moves.  Denies any associated shortness of breath.  Denies any nausea, vomiting, diaphoresis.  Denies any history of heart disease.  Says that she smokes cigarettes as well as uses cocaine occasionally.  Says that she smokes cocaine in the last time she did this was 2 days ago.   Says that the pain is been constant and started when she was sitting and inactive.  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  History reviewed. No pertinent past medical history.  There are no active problems to display for this patient.   Past Surgical History:  Procedure Laterality Date  . CESAREAN SECTION     times two     Prior to Admission medications   Medication Sig Start Date End Date Taking? Authorizing Provider  acetaminophen-codeine (TYLENOL #3) 300-30 MG tablet Take 1-2 tablets by mouth every 4 (four) hours as needed for moderate pain. Patient not taking: Reported on 01/02/2018 01/28/16   Kem Boroughs B, FNP  ibuprofen (ADVIL,MOTRIN) 600 MG tablet Take 1 tablet (600 mg total) by mouth every 6 (six) hours as needed. Patient not taking: Reported on 01/02/2018 01/28/16   Chinita Pester, FNP    Allergies Patient has no known allergies.  No family history on file.  Social History Social History   Tobacco Use  . Smoking status: Current Every Day Smoker    Packs/day: 0.50    Types: Cigarettes  . Smokeless tobacco: Never Used  Substance Use Topics  . Alcohol use: Yes  . Drug use: No    Review of Systems  Constitutional: No fever/chills Eyes: No visual changes. ENT: No sore throat. Cardiovascular: As above Respiratory: Denies shortness of breath. Gastrointestinal: No abdominal pain.  No nausea, no vomiting.  No diarrhea.  No constipation. Genitourinary: Negative for dysuria. Musculoskeletal: Negative for back pain. Skin: Negative for rash. Neurological: Negative for headaches, focal weakness or numbness.   ____________________________________________   PHYSICAL EXAM:  VITAL SIGNS: ED Triage Vitals  Enc Vitals Group     BP 01/02/18 0645 121/84     Pulse Rate 01/02/18 0645 75     Resp 01/02/18 0645 20     Temp 01/02/18 0645 97.9 F (36.6 C)     Temp Source 01/02/18 0645 Oral     SpO2 01/02/18 0645 96 %     Weight 01/02/18 0643 165 lb (74.8 kg)     Height 01/02/18 0643 5\' 7"  (1.702 m)     Head Circumference --      Peak Flow --      Pain Score 01/02/18 0643 10  Pain Loc --      Pain Edu? --      Excl. in GC? --     Constitutional: Alert and oriented. Well appearing and in no acute distress. Eyes: Conjunctivae are normal.  Head: Atraumatic. Nose: No congestion/rhinnorhea. Mouth/Throat: Mucous membranes are moist.  Neck: No stridor.   Cardiovascular: Normal rate, regular rhythm. Grossly normal heart sounds.  Good peripheral circulation equal and bilateral radial as well as dorsalis pedis pulses.  Chest pain is reproducible to palpation of the left side of the chest. Respiratory: Normal respiratory effort.  No retractions. Lungs CTAB. Gastrointestinal: Soft and nontender. No distention.  Musculoskeletal: No lower extremity tenderness  nor edema.  No joint effusions. Neurologic:  Normal speech and language. No gross focal neurologic deficits are appreciated. Skin:  Skin is warm, dry and intact. No rash noted. Psychiatric: Mood and affect are normal. Speech and behavior are normal.  ____________________________________________   LABS (all labs ordered are listed, but only abnormal results are displayed)  Labs Reviewed  BASIC METABOLIC PANEL - Abnormal; Notable for the following components:      Result Value   Glucose, Bld 126 (*)    Calcium 8.8 (*)    All other components within normal limits  CBC  TROPONIN I  POC URINE PREG, ED   ____________________________________________  EKG  ED ECG REPORT I, Arelia Longestavid M Edra Riccardi, the attending physician, personally viewed and interpreted this ECG.   Date: 01/02/2018  EKG Time: 0644  Rate: 75  Rhythm: normal sinus rhythm  Axis: Normal  Intervals:none  ST&T Change: No ST segment elevation or depression.  No abnormal T wave inversion.  ____________________________________________  RADIOLOGY  Chest radiographs are normal. ____________________________________________   PROCEDURES  Procedure(s) performed:   Procedures  Critical Care performed:   ____________________________________________   INITIAL IMPRESSION / ASSESSMENT AND PLAN / ED COURSE  Pertinent labs & imaging results that were available during my care of the patient were reviewed by me and considered in my medical decision making (see chart for details).  Differential diagnosis includes, but is not limited to, ACS, aortic dissection, pulmonary embolism, cardiac tamponade, pneumothorax, pneumonia, pericarditis, myocarditis, GI-related causes including esophagitis/gastritis, and musculoskeletal chest wall pain.   As part of my medical decision making, I reviewed the following data within the electronic MEDICAL RECORD NUMBER Notes from prior ED visits  ----------------------------------------- 9:25 AM on  01/02/2018 -----------------------------------------  Pain greatly reduced after Toradol and Percocet.  Patient prior to the medications is barely able to sit up and is now able to move around freely in the bed.  I recommended that she use ibuprofen as well as a salve such as icy hot or Aspercreme at home.  She is understanding of this plan willing to comply.  Likely chest wall pain.  Very reassuring workup and very atypical symptoms for ACS.  Also is PE RC negative.  Explained the likely diagnosis as well as treatment plan to the patient and she is understanding and willing to comply but will be discharged at this time.      ____________________________________________   FINAL CLINICAL IMPRESSION(S) / ED DIAGNOSES  Chest wall pain.    NEW MEDICATIONS STARTED DURING THIS VISIT:  New Prescriptions   No medications on file     Note:  This document was prepared using Dragon voice recognition software and may include unintentional dictation errors.     Myrna BlazerSchaevitz, Jasalyn Frysinger Matthew, MD 01/02/18 [phone]

## 2018-01-04 LAB — POCT PREGNANCY, URINE: Preg Test, Ur: NEGATIVE

## 2020-06-14 ENCOUNTER — Emergency Department: Payer: Self-pay

## 2020-06-14 ENCOUNTER — Encounter: Payer: Self-pay | Admitting: Emergency Medicine

## 2020-06-14 ENCOUNTER — Emergency Department
Admission: EM | Admit: 2020-06-14 | Discharge: 2020-06-14 | Disposition: A | Discharge status: Home / Self Care | Payer: Self-pay | Attending: Student in an Organized Health Care Education/Training Program | Admitting: Student in an Organized Health Care Education/Training Program

## 2020-06-14 ENCOUNTER — Other Ambulatory Visit: Payer: Self-pay

## 2020-06-14 DIAGNOSIS — F17210 Nicotine dependence, cigarettes, uncomplicated: Secondary | ICD-10-CM | POA: Insufficient documentation

## 2020-06-14 DIAGNOSIS — L03011 Cellulitis of right finger: Secondary | ICD-10-CM | POA: Insufficient documentation

## 2020-06-14 DIAGNOSIS — L03019 Cellulitis of unspecified finger: Secondary | ICD-10-CM

## 2020-06-14 DIAGNOSIS — Z23 Encounter for immunization: Secondary | ICD-10-CM | POA: Insufficient documentation

## 2020-06-14 MED ORDER — BUPIVACAINE HCL (PF) 0.5 % IJ SOLN
10.0000 mL | Freq: Once | INTRAMUSCULAR | Status: AC
  Administered 2020-06-14: 10 mL

## 2020-06-14 MED ORDER — OXYCODONE-ACETAMINOPHEN 5-325 MG PO TABS
1.0000 | ORAL_TABLET | Freq: Once | ORAL | Status: AC
  Administered 2020-06-14: 1 via ORAL
  Filled 2020-06-14: qty 1

## 2020-06-14 MED ORDER — BUPIVACAINE HCL (PF) 0.5 % IJ SOLN: 50.0000 mL | Freq: Once | INTRAMUSCULAR | Status: DC

## 2020-06-14 MED ORDER — SULFAMETHOXAZOLE-TRIMETHOPRIM 800-160 MG PO TABS
1.0000 | ORAL_TABLET | Freq: Once | ORAL | Status: AC
  Administered 2020-06-14: 1 via ORAL
  Filled 2020-06-14: qty 1

## 2020-06-14 MED ORDER — SULFAMETHOXAZOLE-TRIMETHOPRIM 800-160 MG PO TABS: 1.0000 | ORAL_TABLET | Freq: Two times a day (BID) | ORAL | 0 refills | Status: DC

## 2020-06-14 MED ORDER — TETANUS-DIPHTH-ACELL PERTUSSIS 5-2.5-18.5 LF-MCG/0.5 IM SUSP
0.5000 mL | Freq: Once | INTRAMUSCULAR | Status: AC
Start: 2020-06-14 — End: 2020-06-14
  Administered 2020-06-14: 0.5 mL via INTRAMUSCULAR
  Filled 2020-06-14: qty 0.5

## 2020-06-14 NOTE — ED Triage Notes (Signed)
Right middle fingernail broken about one week ago.  Patient tried to use glue to repair nail.  Arrives today c/o finger pain.

## 2020-06-14 NOTE — ED Provider Notes (Signed)
[facility] Emergency Department Provider Note  ____________________________________________  Time seen: Approximately 8:29 AM  I have reviewed the triage vital signs and the nursing notes.   HISTORY  Chief Complaint Finger Injury    HPI Christina Cochran is a 39 y.o. female that presents to the emergency department for evaluation of right middle finger pain worsening over 1 week.  Patient states that her artificial nail broke off about 1 week ago when she tried to glue it back onto her nailbed. No fevers.   History reviewed. No pertinent past medical history.  There are no problems to display for this patient.   Past Surgical History:  Procedure Laterality Date   CESAREAN SECTION     times two     Prior to Admission medications   Medication Sig Start Date End Date Taking? Authorizing Provider  sulfamethoxazole-trimethoprim (BACTRIM DS) 800-160 MG tablet Take 1 tablet by mouth 2 (two) times daily. 06/14/20   Enid Derry, PA-C    Allergies Patient has no known allergies.  No family history on file.  Social History Social History   Tobacco Use   Smoking status: Current Every Day Smoker    Packs/day: 0.50    Types: Cigarettes   Smokeless tobacco: Never Used  Substance Use Topics   Alcohol use: Yes   Drug use: No     Review of Systems  Constitutional: No fever/chills Cardiovascular: No chest pain. Respiratory: No SOB. Gastrointestinal: No abdominal pain.  No nausea, no vomiting.  Musculoskeletal: Positive for finger pain and swelling. Skin: Negative for rash, abrasions, lacerations, ecchymosis. Neurological: Negative for numbness or tingling   ____________________________________________   PHYSICAL EXAM:  VITAL SIGNS: ED Triage Vitals [06/14/20 0727]  Enc Vitals Group     BP (!) 143/87     Pulse Rate (!) 55     Resp 16     Temp 97.9 F (36.6 C)     Temp Source Oral     SpO2 98 %     Weight 165 lb (74.8 kg)      Height 5\' 4"  (1.626 m)     Head Circumference      Peak Flow      Pain Score 7     Pain Loc      Pain Edu?      Excl. in GC?      Constitutional: Alert and oriented. Well appearing and in no acute distress. Eyes: Conjunctivae are normal. PERRL. EOMI. Head: Atraumatic. ENT:      Ears:      Nose: No congestion/rhinnorhea.      Mouth/Throat: Mucous membranes are moist.  Neck: No stridor.  Cardiovascular: Normal rate, regular rhythm.  Good peripheral circulation. Respiratory: Normal respiratory effort without tachypnea or retractions. Lungs CTAB. Good air entry to the bases with no decreased or absent breath sounds. Musculoskeletal: Full range of motion to all extremities. No gross deformities appreciated.  Tenderness to palpation to the right distal volar middle finger.  Limited range of motion of right middle finger due to pain.  Moderate swelling to distal right middle fingertip. Neurologic:  Normal speech and language. No gross focal neurologic deficits are appreciated.  Skin:  Skin is warm, dry and intact. No rash noted. Psychiatric: Mood and affect are normal. Speech and behavior are normal. Patient exhibits appropriate insight and judgement.   ____________________________________________   LABS (all labs ordered are listed, but only abnormal results are displayed)  Labs Reviewed - No data to display ____________________________________________  EKG   ____________________________________________  RADIOLOGY Lexine Baton, personally viewed and evaluated these images (plain radiographs) as part of my medical decision making, as well as reviewing the written report by the radiologist.  DG Finger Middle Right  Result Date: 06/14/2020 CLINICAL DATA:  Third digit pain and swelling for several days EXAM: RIGHT MIDDLE FINGER 2+V COMPARISON:  None. FINDINGS: No acute fracture or dislocation is noted. Mild soft tissue swelling is noted. No radiopaque foreign body is seen.  IMPRESSION: Mild soft tissue swelling without acute bony abnormality Electronically Signed   By: Alcide Clever M.D.   On: 06/14/2020 08:43    ____________________________________________    PROCEDURES  Procedure(s) performed:    Procedures    Medications  Tdap (BOOSTRIX) injection 0.5 mL (0.5 mLs Intramuscular Given 06/14/20 0844)  oxyCODONE-acetaminophen (PERCOCET/ROXICET) 5-325 MG per tablet 1 tablet (1 tablet Oral Given 06/14/20 0844)  bupivacaine (MARCAINE) 0.5 % injection 10 mL (10 mLs Infiltration Given 06/14/20 1108)  sulfamethoxazole-trimethoprim (BACTRIM DS) 800-160 MG per tablet 1 tablet (1 tablet Oral Given 06/14/20 1107)     ____________________________________________   INITIAL IMPRESSION / ASSESSMENT AND PLAN / ED COURSE  Pertinent labs & imaging results that were available during my care of the patient were reviewed by me and considered in my medical decision making (see chart for details).  Review of the [place] CSRS was performed in accordance of the [facility] prior to dispensing any controlled drugs.   Patient's diagnosis is consistent with felon. Vital signs and exam are reassuring. Felon was drained by Dr. Willy Eddy. Patient will be discharged home with prescriptions for Bactrim. Patient is to follow up with orthopedics as directed. Dr. Signa Kell is in agreement with plan and will have is office follow up with patient tomorrow or Monday outpatient. Patient is given ED precautions to return to the ED for any worsening or new symptoms.   Christina Cochran was evaluated in Emergency Department on 06/14/2020 for the symptoms described in the history of present illness. She was evaluated in the context of the global COVID-19 pandemic, which necessitated consideration that the patient might be at risk for infection with the SARS-CoV-2 virus that causes COVID-19. Institutional protocols and algorithms that pertain to the evaluation of patients at risk for COVID-19 are in a  state of rapid change based on information released by regulatory bodies including the CDC and federal and state organizations. These policies and algorithms were followed during the patient's care in the ED.   ____________________________________________  FINAL CLINICAL IMPRESSION(S) / ED DIAGNOSES  Final diagnoses:  Felon of finger      NEW MEDICATIONS STARTED DURING THIS VISIT:  ED Discharge Orders         Ordered    sulfamethoxazole-trimethoprim (BACTRIM DS) 800-160 MG tablet  2 times daily     Discontinue  Reprint     06/14/20 1055              This chart was dictated using voice recognition software/Dragon. Despite best efforts to proofread, errors can occur which can change the meaning. Any change was purely unintentional.    Enid Derry, PA-C 06/14/20 1538    Willy Eddy, MD 06/14/20 1556

## 2020-06-14 NOTE — Discharge Instructions (Addendum)
You have an infection in your fingertip.  Please keep your finger bandaged at all times.  Change the dressing daily.  Please begin antibiotics tonight.  Please call orthopedics this afternoon for a follow-up appointment tomorrow or Monday.

## 2020-06-14 NOTE — ED Notes (Addendum)
See triage note   Having pain to right middle finger  States she broke her nail about 1 week ago  Now having increased pain with swelling

## 2021-03-26 ENCOUNTER — Ambulatory Visit: Payer: Self-pay

## 2021-03-26 ENCOUNTER — Other Ambulatory Visit: Payer: Self-pay

## 2021-03-26 ENCOUNTER — Ambulatory Visit: Payer: Self-pay | Admitting: Family Medicine

## 2021-03-26 ENCOUNTER — Encounter: Payer: Self-pay | Admitting: Family Medicine

## 2021-03-26 DIAGNOSIS — Z7289 Other problems related to lifestyle: Secondary | ICD-10-CM

## 2021-03-26 DIAGNOSIS — F1490 Cocaine use, unspecified, uncomplicated: Secondary | ICD-10-CM

## 2021-03-26 DIAGNOSIS — F1290 Cannabis use, unspecified, uncomplicated: Secondary | ICD-10-CM | POA: Insufficient documentation

## 2021-03-26 DIAGNOSIS — Z789 Other specified health status: Secondary | ICD-10-CM | POA: Insufficient documentation

## 2021-03-26 DIAGNOSIS — Z113 Encounter for screening for infections with a predominantly sexual mode of transmission: Secondary | ICD-10-CM

## 2021-03-26 DIAGNOSIS — Z202 Contact with and (suspected) exposure to infections with a predominantly sexual mode of transmission: Secondary | ICD-10-CM

## 2021-03-26 LAB — WET PREP FOR TRICH, YEAST, CLUE
Clue Cell Exam: POSITIVE — AB
Trichomonas Exam: NEGATIVE
Yeast Exam: NEGATIVE

## 2021-03-26 MED ORDER — DOXYCYCLINE HYCLATE 100 MG PO TABS: 100.0000 mg | ORAL_TABLET | Freq: Two times a day (BID) | ORAL | 0 refills | Status: AC

## 2021-03-26 MED ORDER — CEFTRIAXONE SODIUM 500 MG IJ SOLR
500.0000 mg | Freq: Once | INTRAMUSCULAR | Status: AC
Start: 2021-03-26 — End: 2021-03-26
  Administered 2021-03-26: 500 mg via INTRAMUSCULAR

## 2021-03-26 NOTE — Progress Notes (Signed)
Here for STD testing and needs treatment as contact to gonorrhea.Burt Knack, RN

## 2021-03-26 NOTE — Progress Notes (Signed)
[facility] Department STI clinic/screening visit  Subjective:  Christina Cochran is a 40 y.o. female being seen today for an STI screening visit. The patient reports they do have symptoms.  Patient reports that they do not desire a pregnancy in the next year- had BTL.    Patient's last menstrual period was 03/11/2021 (approximate).   Patient has the following medical conditions:   Patient Active Problem List   Diagnosis Date Noted  . Cocaine use 03/26/2021  . Alcohol use 03/26/2021  . Marijuana use 03/26/2021    Chief Complaint  Patient presents with  . SEXUALLY TRANSMITTED DISEASE    Contact to gonorrhea    HPI  Patient reports vaginal discharge for 1.5 weeks. Recently told by partner that he was positive for gonorrhea.   Last HIV test per patient/review of record was unknown "years" Patient reports last pap was "years" -- recommended BCCCP.   See flowsheet for further details and programmatic requirements.    The following portions of the patient's history were reviewed and updated as appropriate: allergies, current medications, past medical history, past social history, past surgical history and problem list.  Objective:  There were no vitals filed for this visit.  Physical Exam Vitals and nursing note reviewed.  Constitutional:      Appearance: Normal appearance.     Comments: Thin, somewhat cachetic appearing female.    HENT:     Head: Normocephalic and atraumatic.     Mouth/Throat:     Mouth: Mucous membranes are moist.     Pharynx: Oropharynx is clear. No oropharyngeal exudate or posterior oropharyngeal erythema.  Pulmonary:     Effort: Pulmonary effort is normal.  Chest:  Breasts:     Right: No axillary adenopathy or supraclavicular adenopathy.     Left: No axillary adenopathy or supraclavicular adenopathy.    Abdominal:     General: Abdomen is flat.     Palpations: There is no mass.     Tenderness: There is no abdominal tenderness.  There is no rebound.  Genitourinary:    General: Normal vulva.     Exam position: Lithotomy position.     Pubic Area: No rash or pubic lice.      Labia:        Right: No rash or lesion.        Left: No rash or lesion.      Vagina: Normal. No vaginal discharge, erythema, bleeding or lesions.     Cervix: Discharge (yellow creamy. pH<4.5) present. No cervical motion tenderness, friability, lesion or erythema.     Uterus: Normal.      Adnexa: Right adnexa normal and left adnexa normal.     Rectum: Normal.  Lymphadenopathy:     Head:     Right side of head: No preauricular or posterior auricular adenopathy.     Left side of head: No preauricular or posterior auricular adenopathy.     Cervical: No cervical adenopathy.     Upper Body:     Right upper body: No supraclavicular or axillary adenopathy.     Left upper body: No supraclavicular or axillary adenopathy.     Lower Body: No right inguinal adenopathy. No left inguinal adenopathy.  Skin:    General: Skin is warm and dry.     Findings: No rash.  Neurological:     Mental Status: She is alert and oriented to person, place, and time.      Assessment and Plan:  [name] is  a 40 y.o. female presenting to the [facility] Department for STI screening  Patient arrived late for 3:20 appt.   1. Exposure to STD Contact to [facility]-- treat as contact with unknown chlamydia status.   - Syphilis Serology, [facility] - HIV/HCV [place] [facility] - Chlamydia/Gonorrhea [place] [facility] - Chlamydia/Gonorrhea [place] [facility] - WET PREP FOR TRICH, YEAST, CLUE  2. Cocaine use Daily use, last used today  3. Alcohol use Drinks a 40 daily, when she does not drink she get the shakes and has other sx.   4. Marijuana use Daily use  5. Screening examination for venereal disease Patient accepted all screenings including oral, vaginal CT/GC and bloodwork for HIV/RPR and Hep C. Treat wet prep per standing order- only if amine and clue pos.  Patient is a daily etoh user and metronidazole would be difficult medication to take.   Discussed time line for [facility] results and that patient will be called with positive results and encouraged patient to call if she had not heard in 2 weeks.  Counseled to return or seek care for continued or worsening symptoms Recommended condom use with all sex  Patient is currently using BTL to prevent pregnancy.  - Chlamydia/Gonorrhea [place] [facility] - Chlamydia/Gonorrhea  Lab - WET PREP FOR TRICH, YEAST, CLUE  6. Mood sx - endorses anxiety/depression. Declines referral to LCSW today but given card to call and establish appointment.   Return if symptoms worsen or fail to improve.  No future appointments.  Federico Flake, MD

## 2021-03-26 NOTE — Addendum Note (Signed)
Addended by: Burt Knack on: 03/26/2021 05:12 PM   Modules accepted: Orders

## 2021-03-26 NOTE — Progress Notes (Signed)
Patient treated as contact to gonorrhea per SO. Patient counseled to stay for observation for 15-20 minutes. Patient agreed and no issues with medication after 15 minutes. Wet mount reviewed with provider, no new orders. Hazeline Junker card given.Burt Knack, RN

## 2021-04-02 LAB — HM HIV SCREENING LAB: HM HIV Screening: NEGATIVE

## 2021-04-02 LAB — HM HEPATITIS C SCREENING LAB: HM Hepatitis Screen: NEGATIVE

## 2022-01-25 DIAGNOSIS — F1911 Other psychoactive substance abuse, in remission: Secondary | ICD-10-CM | POA: Insufficient documentation

## 2022-03-10 ENCOUNTER — Ambulatory Visit: Payer: Self-pay | Admitting: Physician Assistant

## 2022-03-10 NOTE — Progress Notes (Deleted)
I,Christina Cochran,acting as a Neurosurgeon for [employer], PA-C.,have documented all relevant documentation on the behalf of Christina Lat, PA-C,as directed by  [employer], PA-C while in the presence of [employer], PA-C.  Complete physical exam   Patient: Christina Cochran   DOB: Sep 08, 1981   41 y.o. Female  MRN: [id number] Visit Date: 03/10/2022  Today's healthcare provider: [name], PA-C   No chief complaint on file.  Subjective    KEBRINA Cochran is a 41 y.o. female who presents today for a complete physical exam.  She reports consuming a {diet types:17450} diet. {Exercise:19826} She generally feels {well/fairly well/poorly:18703}. She reports sleeping {well/fairly well/poorly:18703}. She {does/does not:200015} have additional problems to discuss today.  HPI  ***  No past medical history on file. Past Surgical History:  Procedure Laterality Date   CESAREAN SECTION     times two    TUBAL LIGATION     Social History   Socioeconomic History   Marital status: Single    Spouse name: Not on file   Number of children: Not on file   Years of education: Not on file   Highest education level: Not on file  Occupational History   Not on file  Tobacco Use   Smoking status: Every Day    Packs/day: 0.50    Types: Cigarettes   Smokeless tobacco: Never  Substance and Sexual Activity   Alcohol use: Yes    Comment: Daily drinker-- drinks 40oz daily.    Drug use: Yes    Types: Cocaine, Marijuana, "Crack" cocaine   Sexual activity: Yes    Birth control/protection: Surgical  Other Topics Concern   Not on file  Social History Narrative   Not on file   Social Determinants of Health   Financial Resource Strain: Not on file  Food Insecurity: Not on file  Transportation Needs: Not on file  Physical Activity: Not on file  Stress: Not on file  Social Connections: Not on file  Intimate Partner Violence: Not on file   No family status information on file.   No family  history on file. No Known Allergies  Patient Care Team: Pcp, No as PCP - General   Medications: Outpatient Medications Prior to Visit  Medication Sig   sulfamethoxazole-trimethoprim (BACTRIM DS) 800-160 MG tablet Take 1 tablet by mouth 2 (two) times daily.   No facility-administered medications prior to visit.    Review of Systems  {Labs  Heme  Chem  Endocrine  Serology  Results Review (optional):23779}  Objective    There were no vitals taken for this visit. {Show previous vital signs (optional):23777}   Physical Exam  ***  Last depression screening scores     View : No data to display.         Last fall risk screening     View : No data to display.         Last Audit-C alcohol use screening     View : No data to display.         A score of 3 or more in women, and 4 or more in men indicates increased risk for alcohol abuse, EXCEPT if all of the points are from question 1   No results found for any visits on 03/10/22.  Assessment & Plan    Routine Health Maintenance and Physical Exam  Exercise Activities and Dietary recommendations  Goals   None     Immunization History  Administered Date(s) Administered  Tdap 06/14/2020    Health Maintenance  Topic Date Due   COVID-19 Vaccine (1) Never done   URINE MICROALBUMIN  Never done   PAP SMEAR-Modifier  Never done   INFLUENZA VACCINE  06/03/2022   TETANUS/TDAP  06/14/2030   Hepatitis C Screening  Completed   HIV Screening  Completed   HPV VACCINES  Aged Out    Discussed health benefits of physical activity, and encouraged her to engage in regular exercise appropriate for her age and condition.  ***  No follow-ups on file.     {provider attestation***:1}   Christina Cochran, Cordelia Poche  [facility] [phone] (phone) [phone] (fax)  [facility]

## 2022-05-20 ENCOUNTER — Ambulatory Visit: Payer: Self-pay | Admitting: Family Medicine

## 2022-05-20 ENCOUNTER — Encounter: Payer: Self-pay | Admitting: Family Medicine

## 2022-05-20 DIAGNOSIS — Z113 Encounter for screening for infections with a predominantly sexual mode of transmission: Secondary | ICD-10-CM

## 2022-05-20 LAB — HEPATITIS B SURFACE ANTIGEN

## 2022-05-20 LAB — HM HEPATITIS C SCREENING LAB: HM Hepatitis Screen: NEGATIVE

## 2022-05-20 LAB — HM HIV SCREENING LAB: HM HIV Screening: NEGATIVE

## 2022-05-20 NOTE — Progress Notes (Signed)
[facility] Department  STI clinic/screening visit [address] [place] [address] [phone]  Subjective:  Christina Cochran is a 41 y.o. female being seen today for an STI screening visit. The patient reports they do not have symptoms.    Client has had a BTL.   Patient's last menstrual period was 05/18/2022 (exact date).   Patient has the following medical conditions:   Patient Active Problem List   Diagnosis Date Noted   Cocaine use 03/26/2021   Alcohol use 03/26/2021   Marijuana use 03/26/2021    Chief Complaint  Patient presents with   SEXUALLY TRANSMITTED DISEASE    Screening-pt states she she was "told"  she was in contact with someone who is HIV positive    HPI  Patient reports that she was sexually involved with a partner that tested + HIV a year ago.  States that she has had testing 1 year ago for HIV and would like to retest today.  States that she has 1 partner and denies symptoms.    Last HIV test per patient/review of record was 04/02/2021 Patient reports last pap was unknown.  Screening for MPX risk: Does the patient have an unexplained rash? No Is the patient MSM? No Does the patient endorse multiple sex partners or anonymous sex partners? No Did the patient have close or sexual contact with a person diagnosed with MPX? No Has the patient traveled outside the [place] where MPX is endemic? No Is there a high clinical suspicion for MPX-- evidenced by one of the following No  -Unlikely to be chickenpox  -Lymphadenopathy  -Rash that present in same phase of evolution on any given body part See flowsheet for further details and programmatic requirements.   Immunization history:  Immunization History  Administered Date(s) Administered   Tdap 06/14/2020     The following portions of the patient's history were reviewed and updated as appropriate: allergies, current medications, past medical history, past social history, past surgical  history and problem list.  Objective:  There were no vitals filed for this visit.  Physical Exam- client declines exam today.   Assessment and Plan:  Christina Cochran is a 41 y.o. female presenting to the [facility] Department for STI screening  1. Screening examination for venereal disease Client declines GC/Chlamydia testing and exam. - Hepatitis Serology, [facility] - HIV/HCV [facility] - Syphilis Serology, [place] [facility]     Return if symptoms worsen or fail to improve.  No future appointments.  Larene Pickett, FNP

## 2022-05-20 NOTE — Progress Notes (Signed)
Pt here for STI screening.  Declines pelvic exam.  Declines condoms.-Jamesia Linnen, RN

## 2023-09-10 NOTE — Progress Notes (Unsigned)
BP 115/83 (BP Location: Left Arm, Patient Position: Sitting, Cuff Size: Large)   Pulse 81   Temp 98.2 F (36.8 C) (Oral)   Ht 5\' 6"  (1.676 m)   Wt 222 lb 6.4 oz (100.9 kg)   SpO2 95%   BMI 35.90 kg/m    Subjective:    Patient ID: Christina Cochran, female    DOB: July 24, 1981, 42 y.o.   MRN: [id number]  HPI: Christina Cochran is a 42 y.o. female  Chief Complaint  Patient presents with   Establish Care   Diabetes    A1C needs to be checked, takes metformin as needed but not on a daily basis    Patient presents to clinic to establish care with new PCP.  Introduced to Publishing rights manager role and practice setting.  All questions answered.  Discussed provider/patient relationship and expectations. She was previously being seen at the [facility] in [place] and they were filling her medications.  She was diagnosed in march of 2023 at [facility] then she was having incontinence with the metformin.  She hasn't been taking it for about a year.  Patient's A1c was 13.3 in March of 2023.  Does have a history of alcohol use and cocaine use.  She is on Naltrexone.    Patient denies a history of:  Thyroid problems, Depression, Anxiety, Neurological problems, and Abdominal problems.   Patient states she is having left foot pain.  Feels like her heel is splitting.  States it has been going on for about a week or 2.  She hasn't any medications.  It is worse when she stands up and bends over.    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8 C) (Oral)   Ht 5\' 6"  (1.676 m)   Wt 222 lb 6.4 oz (100.9 kg)   SpO2 95%   BMI 35.90 kg/m   Wt Readings from Last 3 Encounters:  09/11/23 222 lb 6.4 oz (100.9 kg)  06/14/20 165 lb (74.8 kg)  01/02/18 165 lb (74.8 kg)    Physical Exam Vitals and nursing note reviewed.  Constitutional:      General: She is not in acute distress.    Appearance: Normal appearance. She is obese. She is not ill-appearing, toxic-appearing or diaphoretic.  HENT:     Head: Normocephalic.     Right Ear: External ear normal.     Left Ear: External ear normal.     Nose: Nose normal.     Mouth/Throat:     Mouth: Mucous membranes are moist.  Pharynx: Oropharynx is clear.  Eyes:     General:        Right eye: No discharge.        Left eye: No discharge.     Extraocular Movements: Extraocular movements intact.     Conjunctiva/sclera: Conjunctivae normal.     Pupils: Pupils are equal, round, and reactive to light.  Cardiovascular:     Rate and Rhythm: Normal rate and regular rhythm.     Heart sounds: No murmur heard. Pulmonary:     Effort: Pulmonary effort is normal. No respiratory distress.     Breath sounds: Normal breath sounds. No wheezing or rales.  Musculoskeletal:     Cervical back: Normal range of motion and neck supple.  Skin:    General: Skin is warm and dry.     Capillary Refill: Capillary refill takes less than 2 seconds.  Neurological:     General: No focal deficit present.     Mental Status: She is alert and oriented to person, place, and time. Mental status is at baseline.  Psychiatric:        Mood and Affect: Mood normal.        Behavior: Behavior normal.         Thought Content: Thought content normal.        Judgment: Judgment normal.     Results for orders placed or performed in visit on 05/28/22  [facility] HIV SCREENING LAB  Result Value Ref Range   [facility] HIV Screening Negative - Validated   [facility] HEPATITIS C SCREENING LAB  Result Value Ref Range   [facility] Hepatitis Screen Negative-Validated       Assessment & Plan:   Problem List Items Addressed This Visit       Endocrine   Diabetes mellitus (HCC) - Primary    Chronic.  Unsure of A1c.  Last A1c was March of 2023 at 13.3%.  Labs ordered today.  Did not tolerate metformin at 1000mg  BID but did okay with 500mg  BID.  Labs ordered today.  Follow up in 1 week.  Call sooner if concerns arise.       Relevant Orders   Comp Met (CMET)   HgB A1c   Lipid Profile   Microalbumin, Urine Waived     Other   Alcohol use    On Naltrexone.  Continue with use.  Continue to follow up with [facility] for management.      Substance abuse in remission [facility])    Patient states she is taking Naltrexone.  Continue to follow up with [facility].       Other Visit Diagnoses     Left foot pain       Will obtain xray.  Suspect it is related to plantar fascitis due to physical exam findings. Will follow up next week with patient.   Relevant Orders   DG Foot Complete Left   Screening for ischemic heart disease       Relevant Orders   Lipid Profile   Need for influenza vaccination       Relevant Orders   Flu vaccine trivalent PF, 6mos and older(Flulaval,Afluria,Fluarix,Fluzone)   Need for COVID-19 vaccine       Relevant Orders   Pfizer Comirnaty Covid -19 Vaccine 62yrs and older   Encounter to establish care            Follow up plan: Return in about 1 week (around 09/18/2023) for Wednesday at 1120.

## 2023-09-11 ENCOUNTER — Ambulatory Visit (INDEPENDENT_AMBULATORY_CARE_PROVIDER_SITE_OTHER): Payer: MEDICAID | Admitting: Nurse Practitioner

## 2023-09-11 ENCOUNTER — Encounter: Payer: Self-pay | Admitting: Nurse Practitioner

## 2023-09-11 VITALS — BP 115/83 | HR 81 | Temp 98.2°F | Ht 66.0 in | Wt 222.4 lb

## 2023-09-11 DIAGNOSIS — E119 Type 2 diabetes mellitus without complications: Secondary | ICD-10-CM | POA: Insufficient documentation

## 2023-09-11 DIAGNOSIS — Z136 Encounter for screening for cardiovascular disorders: Secondary | ICD-10-CM | POA: Diagnosis not present

## 2023-09-11 DIAGNOSIS — F1911 Other psychoactive substance abuse, in remission: Secondary | ICD-10-CM

## 2023-09-11 DIAGNOSIS — M79672 Pain in left foot: Secondary | ICD-10-CM

## 2023-09-11 DIAGNOSIS — Z23 Encounter for immunization: Secondary | ICD-10-CM

## 2023-09-11 DIAGNOSIS — E1169 Type 2 diabetes mellitus with other specified complication: Secondary | ICD-10-CM | POA: Diagnosis not present

## 2023-09-11 DIAGNOSIS — F1090 Alcohol use, unspecified, uncomplicated: Secondary | ICD-10-CM

## 2023-09-11 DIAGNOSIS — Z7689 Persons encountering health services in other specified circumstances: Secondary | ICD-10-CM

## 2023-09-11 DIAGNOSIS — Z789 Other specified health status: Secondary | ICD-10-CM

## 2023-09-11 LAB — MICROALBUMIN, URINE WAIVED
Creatinine, Urine Waived: 200 mg/dL (ref 10–300)
Microalb, Ur Waived: 80 mg/L — ABNORMAL HIGH (ref 0–19)

## 2023-09-11 NOTE — Assessment & Plan Note (Signed)
On Naltrexone.  Continue with use.  Continue to follow up with [facility] for management.

## 2023-09-11 NOTE — Assessment & Plan Note (Signed)
Chronic.  Unsure of A1c.  Last A1c was March of 2023 at 13.3%.  Labs ordered today.  Did not tolerate metformin at 1000mg  BID but did okay with 500mg  BID.  Labs ordered today.  Follow up in 1 week.  Call sooner if concerns arise.

## 2023-09-11 NOTE — Assessment & Plan Note (Signed)
Patient states she is taking Naltrexone.  Continue to follow up with [facility].

## 2023-09-12 ENCOUNTER — Ambulatory Visit
Admission: EM | Admit: 2023-09-12 | Discharge: 2023-09-12 | Disposition: A | Discharge status: Home / Self Care | Payer: MEDICAID | Attending: Emergency Medicine | Admitting: Emergency Medicine

## 2023-09-12 ENCOUNTER — Encounter: Payer: Self-pay | Admitting: *Deleted

## 2023-09-12 DIAGNOSIS — L03012 Cellulitis of left finger: Secondary | ICD-10-CM

## 2023-09-12 LAB — COMPREHENSIVE METABOLIC PANEL
ALT: 18 [IU]/L (ref 0–32)
AST: 19 [IU]/L (ref 0–40)
Albumin: 4.2 g/dL (ref 3.9–4.9)
Alkaline Phosphatase: 64 [IU]/L (ref 44–121)
BUN/Creatinine Ratio: 9 (ref 9–23)
BUN: 8 mg/dL (ref 6–24)
Bilirubin Total: 0.3 mg/dL (ref 0.0–1.2)
CO2: 22 mmol/L (ref 20–29)
Calcium: 9.3 mg/dL (ref 8.7–10.2)
Chloride: 108 mmol/L — ABNORMAL HIGH (ref 96–106)
Creatinine, Ser: 0.94 mg/dL (ref 0.57–1.00)
Globulin, Total: 2.6 g/dL (ref 1.5–4.5)
Glucose: 101 mg/dL — ABNORMAL HIGH (ref 70–99)
Potassium: 3.9 mmol/L (ref 3.5–5.2)
Sodium: 144 mmol/L (ref 134–144)
Total Protein: 6.8 g/dL (ref 6.0–8.5)
eGFR: 77 mL/min/{1.73_m2} (ref >59)

## 2023-09-12 LAB — LIPID PANEL
Chol/HDL Ratio: 3.6 ratio (ref 0.0–4.4)
Cholesterol, Total: 194 mg/dL (ref 100–199)
HDL: 54 mg/dL (ref >39)
LDL Chol Calc (NIH): 123 mg/dL — ABNORMAL HIGH (ref 0–99)
Triglycerides: 95 mg/dL (ref 0–149)
VLDL Cholesterol Cal: 17 mg/dL (ref 5–40)

## 2023-09-12 LAB — HEMOGLOBIN A1C
Est. average glucose Bld gHb Est-mCnc: 146 mg/dL
Hgb A1c MFr Bld: 6.7 % — ABNORMAL HIGH (ref 4.8–5.6)

## 2023-09-12 MED ORDER — DOXYCYCLINE HYCLATE 100 MG PO CAPS: 100.0000 mg | ORAL_CAPSULE | Freq: Two times a day (BID) | ORAL | 0 refills | Status: DC

## 2023-09-12 MED ORDER — PREDNISONE 20 MG PO TABS: 40.0000 mg | ORAL_TABLET | Freq: Every day | ORAL | 0 refills | Status: DC

## 2023-09-12 NOTE — ED Triage Notes (Signed)
Pt reports traumatically lifting her acrylic nail from the nailbed of left index finger approx 1 wk ago. States went to have the nail repaired, and the tech "just put acrylic over it", and now having pain, swelling, tightness, and tenderness to finger. Denies fevers.

## 2023-09-12 NOTE — ED Provider Notes (Signed)
Renaldo Fiddler    CSN: [id number] Arrival date & time: 09/12/23  4166      History   Chief Complaint Chief Complaint  Patient presents with   Finger Pain    HPI Christina Cochran is a 42 y.o. female.   Patient presents for evaluation of pain and swelling to the left index finger beginning 7 days ago.  Endorses that she had on artificial acrylic fingernails which cracked about midway across the nailbed, at that time did not believe that real nail was affected.  Went for repair at the nail shop in which they applied acrylic over the injury.  Since has been symptomatic, progressively worsening.  Denies fever.  Past Medical History:  Diagnosis Date   Diabetes mellitus without complication 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 Authorizing Provider  cariprazine (VRAYLAR) 1.5 MG capsule Take by mouth. Take one tablet by mouth daily for mood   Yes [provider]  doxycycline (VIBRAMYCIN) 100 MG capsule Take 1 capsule (100 mg total) by mouth 2 (two) times daily. 09/12/23  Yes Ashby Moskal R, NP  naltrexone (DEPADE) 50 MG tablet Take by mouth daily. Take one tablet by mouth daily at the same time everyday   Yes [provider]  predniSONE (DELTASONE) 20 MG tablet Take 2 tablets (40 mg total) by mouth daily. 09/12/23  Yes Kazue Cerro R, NP  traZODone (DESYREL) 100 MG tablet Take 100  mg by mouth at bedtime. Take one tablet by mouth at bedtime as needed for sleep   Yes [provider]    Family History Family History  Problem Relation Age of Onset   Diabetes Paternal Grandmother     Social History Social History   Tobacco Use   Smoking status: Some Days    Current packs/day: 0.50    Average packs/day: 0.5 packs/day for 23.9 years (11.9 ttl pk-yrs)    Types: Cigarettes    Start date: 2001   Smokeless tobacco: Never  Vaping Use   Vaping status: Former  Substance Use Topics   Alcohol use: Not Currently   Drug use: Not Currently    Types: Cocaine, Marijuana, "Crack" cocaine    Comment: no use since 08/24/2021     Allergies   Patient has no known allergies.   Review of Systems Review of Systems   Physical Exam Triage Vital Signs ED Triage Vitals  Encounter Vitals Group     BP 09/12/23 0949 (!) 142/90     Systolic BP Percentile --  Diastolic BP Percentile --      Pulse Rate 09/12/23 0949 66     Resp 09/12/23 0949 18     Temp 09/12/23 0949 98.8 F (37.1 C)     Temp Source 09/12/23 0949 Oral     SpO2 09/12/23 0949 99 %     Weight --      Height --      Head Circumference --      Peak Flow --      Pain Score 09/12/23 0950 7     Pain Loc --      Pain Education --      Exclude from Growth Chart --    No data found.  Updated Vital Signs BP (!) 142/90   Pulse 66   Temp 98.8 F (37.1 C) (Oral)   Resp 18   LMP 09/09/2023 (Approximate)   SpO2 99%   Visual Acuity Right Eye Distance:   Left Eye Distance:   Bilateral Distance:    Right Eye Near:   Left Eye Near:    Bilateral Near:     Physical Exam Constitutional:      Appearance: Normal appearance.  Eyes:     Extraocular Movements: Extraocular movements intact.  Pulmonary:     Effort: Pulmonary effort is normal.  Skin:    Comments: Erythema to the left index nailbed, tenderness and swelling to the distal phalanx, sensation intact, capillary refill less than 3, has  full range of motion with no involvement of the DIP joint  Neurological:     Mental Status: She is alert and oriented to person, place, and time. Mental status is at baseline.      UC Treatments / Results  Labs (all labs ordered are listed, but only abnormal results are displayed) Labs Reviewed - No data to display  EKG   Radiology No results found.  Procedures Procedures (including critical care time)  Medications Ordered in UC Medications - No data to display  Initial Impression / Assessment and Plan / UC Course  I have reviewed the triage vital signs and the nursing notes.  Pertinent labs & imaging results that were available during my care of the patient were reviewed by me and considered in my medical decision making (see chart for details).  Infection of fingernail left hand  Presentation and symptomology is consistent with infection, prescribed doxycycline and prednisone to help reduce symptoms, recommend ice or heat over the affected area with activity as tolerated, strongly advise removal of artificial nail to soon as possible, advised against the use of a drill to do so and advised soaking in acetone, given strict precautions for any swelling or tightness that worsens to go to nearest emergency department for concerns of a felon, advised return to urgent care for any persisting symptoms past use of prescribed antibiotic Final Clinical Impressions(s) / UC Diagnoses   Final diagnoses:  Infection of fingernail of left hand     Discharge Instructions      Today you are evaluated for the pain and swelling to your fingernail, believe the actual nailbed itself is infected based on your exam  Begin doxycycline every morning and every evening for 7 days to clear any germs contributing to symptoms  Begin prednisone every morning with food for 5 days to help reduce swelling and help with pain, may take Tylenol as needed in addition to this  May apply ice or heat over  the affected area in 10 to 15-minute intervals  Please return  nail shop as soon as possible and have them soak off the fingernail, do not allow them to drill as this can cause further irritation and damage to your nailbed  If you decide to stop the fingernail off at home you may do so with 100% acetone, place and a glass bowl only do not use plastic as it will disintegrate and cause the acetone to spill everywhere  If the swelling and tightness to the finger tip worsens at any point please go to the nearest emergency department for evaluation, if symptoms persist or do not improve with use of antibiotic please return for reevaluation    ED Prescriptions     Medication Sig Dispense Auth. Provider   doxycycline (VIBRAMYCIN) 100 MG capsule Take 1 capsule (100 mg total) by mouth 2 (two) times daily. 14 capsule Anna Livers R, NP   predniSONE (DELTASONE) 20 MG tablet Take 2 tablets (40 mg total) by mouth daily. 10 tablet Valinda Hoar, NP      PDMP not reviewed this encounter.   Valinda Hoar, NP 09/12/23 1018

## 2023-09-12 NOTE — Discharge Instructions (Signed)
Today you are evaluated for the pain and swelling to your fingernail, believe the actual nailbed itself is infected based on your exam  Begin doxycycline every morning and every evening for 7 days to clear any germs contributing to symptoms  Begin prednisone every morning with food for 5 days to help reduce swelling and help with pain, may take Tylenol as needed in addition to this  May apply ice or heat over the affected area in 10 to 15-minute intervals  Please return nail shop as soon as possible and have them soak off the fingernail, do not allow them to drill as this can cause further irritation and damage to your nailbed  If you decide to stop the fingernail off at home you may do so with 100% acetone, place and a glass bowl only do not use plastic as it will disintegrate and cause the acetone to spill everywhere  If the swelling and tightness to the finger tip worsens at any point please go to the nearest emergency department for evaluation, if symptoms persist or do not improve with use of antibiotic please return for reevaluation

## 2023-09-14 DIAGNOSIS — Z23 Encounter for immunization: Secondary | ICD-10-CM | POA: Diagnosis not present

## 2023-09-15 NOTE — Progress Notes (Deleted)
LMP 09/09/2023 (Approximate)    Subjective:    Patient ID: Christina Cochran, female    DOB: 1981-04-15, 42 y.o.   MRN: [id number]  HPI: Christina Cochran is a 42 y.o. female  No chief complaint on file.  DIABETES Hypoglycemic episodes:{Blank single:19197::"yes","no"} Polydipsia/polyuria: {Blank single:19197::"yes","no"} Visual disturbance: {Blank single:19197::"yes","no"} Chest pain: {Blank single:19197::"yes","no"} Paresthesias: {Blank single:19197::"yes","no"} Glucose Monitoring: {Blank single:19197::"yes","no"}  Accucheck frequency: {Blank single:19197::"Not Checking","Daily","BID","TID"}  Fasting glucose:  Post prandial:  Evening:  Before meals: Taking Insulin?: {Blank single:19197::"yes","no"}  Long acting insulin:  Short acting insulin: Blood Pressure Monitoring: {Blank single:19197::"not checking","rarely","daily","weekly","monthly","a few times a day","a few times a week","a few times a month"} Retinal Examination: {Blank single:19197::"Up to Date","Not up to Date"} Foot Exam: {Blank single:19197::"Up to Date","Not up to Date"} Diabetic Education: {Blank single:19197::"Completed","Not Completed"} Pneumovax: {Blank single:19197::"Up to Date","Not up to Date","unknown"} Influenza: {Blank single:19197::"Up to Date","Not up to Date","unknown"} Aspirin: {Blank single:19197::"yes","no"}  HYPERTENSION / HYPERLIPIDEMIA Satisfied with current treatment? {Blank single:19197::"yes","no"} Duration of hypertension: {Blank single:19197::"chronic","months","years"} BP monitoring frequency: {Blank single:19197::"not checking","rarely","daily","weekly","monthly","a few times a day","a few times a week","a few times a month"} BP range:  BP medication side effects: {Blank single:19197::"yes","no"} Past BP meds: {Blank multiple:19196::"none","amlodipine","amlodipine/benazepril","atenolol","benazepril","benazepril/HCTZ","bisoprolol  (bystolic)","carvedilol","chlorthalidone","clonidine","diltiazem","exforge HCT","HCTZ","irbesartan (avapro)","labetalol","lisinopril","lisinopril-HCTZ","losartan (cozaar)","methyldopa","nifedipine","olmesartan (benicar)","olmesartan-HCTZ","quinapril","ramipril","spironalactone","tekturna","valsartan","valsartan-HCTZ","verapamil"} Duration of hyperlipidemia: {Blank single:19197::"chronic","months","years"} Cholesterol medication side effects: {Blank single:19197::"yes","no"} Cholesterol supplements: {Blank multiple:19196::"none","fish oil","niacin","red yeast rice"} Past cholesterol medications: {Blank multiple:19196::"none","atorvastain (lipitor)","lovastatin (mevacor)","pravastatin (pravachol)","rosuvastatin (crestor)","simvastatin (zocor)","vytorin","fenofibrate (tricor)","gemfibrozil","ezetimide (zetia)","niaspan","lovaza"} Medication compliance: {Blank single:19197::"excellent compliance","good compliance","fair compliance","poor compliance"} Aspirin: {Blank single:19197::"yes","no"} Recent stressors: {Blank single:19197::"yes","no"} Recurrent headaches: {Blank single:19197::"yes","no"} Visual changes: {Blank single:19197::"yes","no"} Palpitations: {Blank single:19197::"yes","no"} Dyspnea: {Blank single:19197::"yes","no"} Chest pain: {Blank single:19197::"yes","no"} Lower extremity edema: {Blank single:19197::"yes","no"} Dizzy/lightheaded: {Blank single:19197::"yes","no"}  Relevant past medical, surgical, family and social history reviewed and updated as indicated. Interim medical history since our last visit reviewed. Allergies and medications reviewed and updated.  Review of Systems  Per HPI unless specifically indicated above     Objective:    LMP 09/09/2023 (Approximate)   Wt Readings from Last 3 Encounters:  09/11/23 222 lb 6.4 oz (100.9 kg)  06/14/20 165 lb (74.8 kg)  01/02/18 165 lb (74.8 kg)    Physical Exam  Results for orders placed or performed in visit on 09/11/23   Comp Met (CMET)  Result Value Ref Range   Glucose 101 (H) 70 - 99 mg/dL   BUN 8 6 - 24 mg/dL   Creatinine, Ser 1.09 0.57 - 1.00 mg/dL   eGFR 77 >32 TF/TDD/2.20   BUN/Creatinine Ratio 9 9 - 23   Sodium 144 134 - 144 mmol/L   Potassium 3.9 3.5 - 5.2 mmol/L   Chloride 108 (H) 96 - 106 mmol/L   CO2 22 20 - 29 mmol/L   Calcium 9.3 8.7 - 10.2 mg/dL   Total Protein 6.8 6.0 - 8.5 g/dL   Albumin 4.2 3.9 - 4.9 g/dL   Globulin, Total 2.6 1.5 - 4.5 g/dL   Bilirubin Total 0.3 0.0 - 1.2 mg/dL   Alkaline Phosphatase 64 44 - 121 IU/L   AST 19 0 - 40 IU/L   ALT 18 0 - 32 IU/L  HgB A1c  Result Value Ref Range   Hgb A1c MFr Bld 6.7 (H) 4.8 - 5.6 %   Est. average glucose Bld gHb Est-mCnc 146 mg/dL  Lipid Profile  Result Value Ref Range   Cholesterol, Total 194 100 - 199 mg/dL   Triglycerides 95 0 - 149 mg/dL   HDL 54 >25 mg/dL   VLDL Cholesterol Cal 17 5 - 40 mg/dL   LDL Chol  Calc (NIH) 123 (H) 0 - 99 mg/dL   Chol/HDL Ratio 3.6 0.0 - 4.4 ratio  Microalbumin, Urine Waived  Result Value Ref Range   Microalb, Ur Waived 80 (H) 0 - 19 mg/L   Creatinine, Urine Waived 200 10 - 300 mg/dL   Microalb/Creat Ratio 30-300 (H) <30 mg/g      Assessment & Plan:   Problem List Items Addressed This Visit   None    Follow up plan: No follow-ups on file.

## 2023-09-16 ENCOUNTER — Ambulatory Visit: Payer: MEDICAID | Admitting: Nurse Practitioner

## 2023-09-19 ENCOUNTER — Emergency Department
Admission: EM | Admit: 2023-09-19 | Discharge: 2023-09-20 | Disposition: A | Discharge status: Home / Self Care | Payer: MEDICAID | Attending: Student in an Organized Health Care Education/Training Program | Admitting: Student in an Organized Health Care Education/Training Program

## 2023-09-19 ENCOUNTER — Other Ambulatory Visit: Payer: Self-pay

## 2023-09-19 DIAGNOSIS — M7989 Other specified soft tissue disorders: Secondary | ICD-10-CM | POA: Diagnosis present

## 2023-09-19 DIAGNOSIS — L03011 Cellulitis of right finger: Secondary | ICD-10-CM | POA: Insufficient documentation

## 2023-09-19 MED ORDER — SULFAMETHOXAZOLE-TRIMETHOPRIM 800-160 MG PO TABS: 1.0000 | ORAL_TABLET | Freq: Two times a day (BID) | ORAL | 0 refills | Status: DC

## 2023-09-19 MED ORDER — OXYCODONE-ACETAMINOPHEN 5-325 MG PO TABS
1.0000 | ORAL_TABLET | Freq: Once | ORAL | Status: AC
  Administered 2023-09-19: 1 via ORAL
  Filled 2023-09-19: qty 1

## 2023-09-19 NOTE — ED Triage Notes (Addendum)
Pt to ed from home via POV for her nail problem. Pt was just seen here on 11/9 for same. Pt advised it is not getting any better so she came back. Pt is caox4, in no acute distress and ambulatory in triage.

## 2023-09-19 NOTE — ED Provider Notes (Signed)
[facility] Provider Note  Patient Contact: 11:11 PM (approximate)   History   Finger Injury (abscess)   HPI  Christina Cochran is a 42 y.o. female who presents the emergency department complaining of finger pain, swelling.  Patient broke a nail roughly a week ago, had her nail salon put an extra layer of acrylic over it to stabilize the nail.  Patient has developed some pain and erythema.  She went to urgent care, was placed on doxycycline and steroid.  Patient states that she still has symptoms.  She states that it looks swollen around the nailbed itself.  No sausage digit.  Pain with flexion but able to flex and extend the fingers.     Physical Exam   Triage Vital Signs: ED Triage Vitals  Encounter Vitals Group     BP 09/19/23 1935 (!) 156/95     Systolic BP Percentile --      Diastolic BP Percentile --      Pulse Rate 09/19/23 1935 70     Resp 09/19/23 1935 14     Temp 09/19/23 1935 98.2 F (36.8 C)     Temp Source 09/19/23 1935 Oral     SpO2 09/19/23 1935 99 %     Weight --      Height --      Head Circumference --      Peak Flow --      Pain Score 09/19/23 1933 10     Pain Loc --      Pain Education --      Exclude from Growth Chart --     Most recent vital signs: Vitals:   09/19/23 1935  BP: (!) 156/95  Pulse: 70  Resp: 14  Temp: 98.2 F (36.8 C)  SpO2: 99%     General: Alert and in no acute distress.  Cardiovascular:  Good peripheral perfusion Respiratory: Normal respiratory effort without tachypnea or retractions. Lungs CTAB.  Musculoskeletal: Full range of motion to all extremities.  Patient has findings consistent with a paronychia around the index finger nailbed.  This occurs along the lateral and proximal area.  There is no evidence of felon's.  While patient has some slight edema of the distal portion of the finger there is no sausage digit, pain over the flexor or extensor tendon concerning for infectious tenosynovitis.   Sensation and cap refill intact. Neurologic:  No gross focal neurologic deficits are appreciated.  Skin:   No rash noted Other:   ED Results / Procedures / Treatments   Labs (all labs ordered are listed, but only abnormal results are displayed) Labs Reviewed - No data to display   EKG     RADIOLOGY    No results found.  PROCEDURES:  Critical Care performed: No  Procedures   MEDICATIONS ORDERED IN ED: Medications  oxyCODONE-acetaminophen (PERCOCET/ROXICET) 5-325 MG per tablet 1 tablet (1 tablet Oral Given 09/19/23 2207)     IMPRESSION / MDM / ASSESSMENT AND PLAN / ED COURSE  I reviewed the triage vital signs and the nursing notes.                                 Differential diagnosis includes, but is not limited to, paronychia, felon's, infectious tenosynovitis   Patient's presentation is most consistent with acute presentation with potential threat to life or bodily function.   Patient's diagnosis is consistent with paronychia.  Patient  presents to the emergency department complaining of ongoing pain, swelling around her nailbed of her index finger of the right hand.  Patient broken nail, had antibiotics and prednisone prescribed from urgent care.  Patient had findings consistent with paronychia.  I have drained the paronychia, provided wound care instructions to the paronychia.  We will change the antibiotic to Bactrim at this time..  Concerning signs and symptoms warranting return/follow-up are discussed.  Follow-up primary care as needed.  Patient is given ED precautions to return to the ED for any worsening or new symptoms.     FINAL CLINICAL IMPRESSION(S) / ED DIAGNOSES   Final diagnoses:  Paronychia of finger of right hand     Rx / DC Orders   ED Discharge Orders          Ordered    sulfamethoxazole-trimethoprim (BACTRIM DS) 800-160 MG tablet  2 times daily        09/19/23 2315             Note:  This document was prepared using  Dragon voice recognition software and may include unintentional dictation errors.   Lanette Hampshire 09/19/23 2315    Willy Eddy, MD 09/20/23 1058

## 2023-09-19 NOTE — ED Notes (Signed)
See triage note. Pt with obvious inflammation to first digit at base of nail bed and at tip of nail bed. Provider at patient bedside.

## 2023-11-10 ENCOUNTER — Encounter: Payer: Self-pay | Admitting: Nurse Practitioner

## 2023-11-10 ENCOUNTER — Ambulatory Visit: Payer: MEDICAID | Admitting: Nurse Practitioner

## 2023-11-10 DIAGNOSIS — F1911 Other psychoactive substance abuse, in remission: Secondary | ICD-10-CM

## 2023-11-10 DIAGNOSIS — E1169 Type 2 diabetes mellitus with other specified complication: Secondary | ICD-10-CM

## 2023-11-10 MED ORDER — LISINOPRIL 2.5 MG PO TABS: 2.5000 mg | ORAL_TABLET | Freq: Every day | ORAL | 1 refills | Status: DC

## 2023-11-10 MED ORDER — ROSUVASTATIN CALCIUM 5 MG PO TABS: 5.0000 mg | ORAL_TABLET | Freq: Every day | ORAL | 1 refills | Status: DC

## 2023-11-10 NOTE — Assessment & Plan Note (Signed)
 Chronic.  Well controlled on Naltrexone.

## 2023-11-10 NOTE — Progress Notes (Signed)
 BP 113/81 (BP Location: Right Arm, Patient Position: Sitting, Cuff Size: Large)   Pulse 82   Temp 97.8 F (36.6 C) (Oral)   Ht 5' 6 (1.676 m)   Wt 227 lb 12.8 oz (103.3 kg)   SpO2 99%   BMI 36.77 kg/m    Subjective:    Patient ID: Christina Cochran, female    DOB: 12-Apr-1981, 43 y.o.   MRN: [id number]  HPI: Christina Cochran is a 43 y.o. female  Chief Complaint  Patient presents with   Follow-up   Diabetes   DIABETES Not currently on any medication.  Last A1c was 6.7%. Hypoglycemic episodes:no Polydipsia/polyuria: no Visual disturbance: no Chest pain: no Paresthesias: no Glucose Monitoring: no  Accucheck frequency:  sometimes  Fasting glucose: 116  Post prandial:  Evening:  Before meals: Taking Insulin?: no  Long acting insulin:  Short acting insulin: Blood Pressure Monitoring: not checking Retinal Examination: Not up to Date Foot Exam: Up to Date Diabetic Education: Not Completed Pneumovax: Not up to Date Influenza: Up to Date Aspirin: no  Relevant past medical, surgical, family and social history reviewed and updated as indicated. Interim medical history since our last visit reviewed. Allergies and medications reviewed and updated.  Review of Systems  Eyes:  Negative for visual disturbance.  Cardiovascular:  Negative for chest pain.  Endocrine: Negative for polydipsia and polyuria.  Neurological:  Negative for numbness.    Per HPI unless specifically indicated above     Objective:    BP 113/81 (BP Location: Right Arm, Patient Position: Sitting, Cuff Size: Large)   Pulse 82   Temp 97.8 F (36.6 C) (Oral)   Ht 5' 6 (1.676 m)   Wt 227 lb 12.8 oz (103.3 kg)   SpO2 99%   BMI 36.77 kg/m   Wt Readings from Last 3 Encounters:  11/10/23 227 lb 12.8 oz (103.3 kg)  09/11/23 222 lb 6.4 oz (100.9 kg)  06/14/20 165 lb (74.8 kg)    Physical Exam Vitals and nursing note reviewed.  Constitutional:      General: She is not in acute distress.     Appearance: Normal appearance. She is obese. She is not ill-appearing, toxic-appearing or diaphoretic.  HENT:     Head: Normocephalic.     Right Ear: External ear normal.     Left Ear: External ear normal.     Nose: Nose normal.     Mouth/Throat:     Mouth: Mucous membranes are moist.     Pharynx: Oropharynx is clear.  Eyes:     General:        Right eye: No discharge.        Left eye: No discharge.     Extraocular Movements: Extraocular movements intact.     Conjunctiva/sclera: Conjunctivae normal.     Pupils: Pupils are equal, round, and reactive to light.  Cardiovascular:     Rate and Rhythm: Normal rate and regular rhythm.     Heart sounds: No murmur heard. Pulmonary:     Effort: Pulmonary effort is normal. No respiratory distress.     Breath sounds: Normal breath sounds. No wheezing or rales.  Musculoskeletal:     Cervical back: Normal range of motion and neck supple.  Skin:    General: Skin is warm and dry.     Capillary Refill: Capillary refill takes less than 2 seconds.  Neurological:     General: No focal deficit present.     Mental Status: She is  alert and oriented to person, place, and time. Mental status is at baseline.  Psychiatric:        Mood and Affect: Mood normal.        Behavior: Behavior normal.        Thought Content: Thought content normal.        Judgment: Judgment normal.     Results for orders placed or performed in visit on 09/11/23  Microalbumin, Urine Waived   Collection Time: 09/11/23 11:28 AM  Result Value Ref Range   Microalb, Ur Waived 80 (H) 0 - 19 mg/L   Creatinine, Urine Waived 200 10 - 300 mg/dL   Microalb/Creat Ratio 30-300 (H) <30 mg/g  Comp Met (CMET)   Collection Time: 09/11/23 11:31 AM  Result Value Ref Range   Glucose 101 (H) 70 - 99 mg/dL   BUN 8 6 - 24 mg/dL   Creatinine, Ser 9.05 0.57 - 1.00 mg/dL   eGFR 77 >40 fO/fpw/8.26   BUN/Creatinine Ratio 9 9 - 23   Sodium 144 134 - 144 mmol/L   Potassium 3.9 3.5 - 5.2 mmol/L    Chloride 108 (H) 96 - 106 mmol/L   CO2 22 20 - 29 mmol/L   Calcium  9.3 8.7 - 10.2 mg/dL   Total Protein 6.8 6.0 - 8.5 g/dL   Albumin 4.2 3.9 - 4.9 g/dL   Globulin, Total 2.6 1.5 - 4.5 g/dL   Bilirubin Total 0.3 0.0 - 1.2 mg/dL   Alkaline Phosphatase 64 44 - 121 IU/L   AST 19 0 - 40 IU/L   ALT 18 0 - 32 IU/L  HgB A1c   Collection Time: 09/11/23 11:31 AM  Result Value Ref Range   Hgb A1c MFr Bld 6.7 (H) 4.8 - 5.6 %   Est. average glucose Bld gHb Est-mCnc 146 mg/dL  Lipid Profile   Collection Time: 09/11/23 11:31 AM  Result Value Ref Range   Cholesterol, Total 194 100 - 199 mg/dL   Triglycerides 95 0 - 149 mg/dL   HDL 54 >60 mg/dL   VLDL Cholesterol Cal 17 5 - 40 mg/dL   LDL Chol Calc (NIH) 876 (H) 0 - 99 mg/dL   Chol/HDL Ratio 3.6 0.0 - 4.4 ratio      Assessment & Plan:   Problem List Items Addressed This Visit       Endocrine   Type 2 diabetes mellitus with other specified complication, without long-term current use of insulin (HCC)   Chronic.  Well controlled with diet.  Last A1c was 6.7%. Will continue without medication at this time.  Will start ACE and Statin to help with protection in the setting of diabetes.  Discussed side effects and benefits of both medications with patient during visit.  Discussed with patient during visit today.  Discussed eye exam during visit today.  Follow up in 6 months.  Call sooner if concerns arise.       Relevant Medications   lisinopril  (ZESTRIL ) 2.5 MG tablet   rosuvastatin  (CRESTOR ) 5 MG tablet     Other   Substance abuse in remission (HCC)   Chronic.  Well controlled on Naltrexone.       Obesity, morbid (HCC) - Primary   Recommended eating smaller high protein, low fat meals more frequently and exercising 30 mins a day 5 times a week with a goal of 10-15lb weight loss in the next 3 months.         Follow up plan: Return in about 6 months (around  05/09/2024) for HTN, HLD, DM2 FU.

## 2023-11-10 NOTE — Assessment & Plan Note (Signed)
 Chronic.  Well controlled with diet.  Last A1c was 6.7%. Will continue without medication at this time.  Will start ACE and Statin to help with protection in the setting of diabetes.  Discussed side effects and benefits of both medications with patient during visit.  Discussed with patient during visit today.  Discussed eye exam during visit today.  Follow up in 6 months.  Call sooner if concerns arise.

## 2023-11-10 NOTE — Assessment & Plan Note (Signed)
 Recommended eating smaller high protein, low fat meals more frequently and exercising 30 mins a day 5 times a week with a goal of 10-15lb weight loss in the next 3 months.

## 2023-11-11 ENCOUNTER — Other Ambulatory Visit (HOSPITAL_COMMUNITY)
Admission: RE | Admit: 2023-11-11 | Discharge: 2023-11-11 | Disposition: A | Discharge status: Home / Self Care | Payer: MEDICAID | Source: Ambulatory Visit | Attending: Nurse Practitioner | Admitting: Nurse Practitioner

## 2023-11-11 ENCOUNTER — Encounter: Payer: Self-pay | Admitting: Nurse Practitioner

## 2023-11-11 ENCOUNTER — Ambulatory Visit: Payer: MEDICAID | Admitting: Nurse Practitioner

## 2023-11-11 VITALS — BP 114/79 | HR 85 | Temp 97.9°F | Wt 224.8 lb

## 2023-11-11 DIAGNOSIS — Z1231 Encounter for screening mammogram for malignant neoplasm of breast: Secondary | ICD-10-CM

## 2023-11-11 DIAGNOSIS — E1169 Type 2 diabetes mellitus with other specified complication: Secondary | ICD-10-CM | POA: Diagnosis not present

## 2023-11-11 DIAGNOSIS — Z136 Encounter for screening for cardiovascular disorders: Secondary | ICD-10-CM

## 2023-11-11 DIAGNOSIS — Z0000 Encounter for general adult medical examination without abnormal findings: Secondary | ICD-10-CM | POA: Insufficient documentation

## 2023-11-11 DIAGNOSIS — Z720 Tobacco use: Secondary | ICD-10-CM

## 2023-11-11 LAB — URINALYSIS, ROUTINE W REFLEX MICROSCOPIC
Bilirubin, UA: NEGATIVE
Glucose, UA: NEGATIVE
Ketones, UA: NEGATIVE
Nitrite, UA: NEGATIVE
Protein,UA: NEGATIVE
RBC, UA: NEGATIVE
Specific Gravity, UA: 1.02 (ref 1.005–1.030)
Urobilinogen, Ur: 0.2 mg/dL (ref 0.2–1.0)
pH, UA: 6 (ref 5.0–7.5)

## 2023-11-11 LAB — MICROSCOPIC EXAMINATION: Bacteria, UA: NONE SEEN

## 2023-11-11 NOTE — Progress Notes (Addendum)
 BP 114/79 (BP Location: Right Arm, Patient Position: Sitting, Cuff Size: Large)   Pulse 85   Temp 97.9 F (36.6 C) (Oral)   Wt 224 lb 12.8 oz (102 kg)   SpO2 95%   BMI 36.28 kg/m    Subjective:    Patient ID: Christina Cochran, female    DOB: 09-15-1981, 42 y.o.   MRN: [id number]  HPI: Christina Cochran is a 43 y.o. female presenting on 11/11/2023 for comprehensive medical examination. Current medical complaints include:none  She currently lives with: Menopausal Symptoms: no   Denies HA, CP, SOB, dizziness, palpitations, visual changes, and lower extremity swelling.  SMOKING CESSATION Smoking Status: 3-4 cigarettes per day  Smoking Amount:  Smoking Onset:  Smoking Quit Date:  Smoking triggers: Type of tobacco use:      Depression Screen done today and results listed below:     11/11/2023   10:39 AM 11/10/2023   10:49 AM 09/11/2023   11:02 AM  Depression screen PHQ 2/9  Decreased Interest 0 0 2  Down, Depressed, Hopeless 0 0 1  PHQ - 2 Score 0 0 3  Altered sleeping 0 0 3  Tired, decreased energy 0 0 2  Change in appetite 0 0   Feeling bad or failure about yourself  0 0 1  Trouble concentrating 0 0 1  Moving slowly or fidgety/restless 0 0 0  Suicidal thoughts 0 0 0  PHQ-9 Score 0 0 10    The patient does not have a history of falls. I did complete a risk assessment for falls. A plan of care for falls was documented.   Past Medical History:  Past Medical History:  Diagnosis Date   Diabetes mellitus without complication (HCC)     Surgical History:  Past Surgical History:  Procedure Laterality Date   CESAREAN SECTION     times two    TUBAL LIGATION      Medications:  Current Outpatient Medications on File Prior to Visit  Medication Sig   cariprazine (VRAYLAR) 1.5 MG capsule Take by mouth. Take one tablet by mouth daily for mood   lisinopril  (ZESTRIL ) 2.5 MG tablet Take 1 tablet (2.5 mg total) by mouth daily.   naltrexone (DEPADE) 50 MG tablet Take by mouth  daily. Take one tablet by mouth daily at the same time everyday   rosuvastatin  (CRESTOR ) 5 MG tablet Take 1 tablet (5 mg total) by mouth daily.   traZODone (DESYREL) 100 MG tablet Take 100 mg by mouth at bedtime. Take one tablet by mouth at bedtime as needed for sleep   No current facility-administered medications on file prior to visit.    Allergies:  Not on File  Social History:  Social History   Socioeconomic History   Marital status: Single    Spouse name: Not on file   Number of children: Not on file   Years of education: Not on file   Highest education level: Not on file  Occupational History   Not on file  Tobacco Use   Smoking status: Some Days    Current packs/day: 0.50    Average packs/day: 0.5 packs/day for 24.0 years (12.0 ttl pk-yrs)    Types: Cigarettes    Start date: 2001   Smokeless tobacco: Never  Vaping Use   Vaping status: Former  Substance and Sexual Activity   Alcohol use: Not Currently   Drug use: Not Currently    Types: Cocaine, Marijuana, Crack cocaine    Comment: no use  since 08/24/2021   Sexual activity: Yes    Birth control/protection: Surgical  Other Topics Concern   Not on file  Social History Narrative   Not on file   Social Drivers of Health   Financial Resource Strain: Low Risk  (09/11/2023)   Overall Financial Resource Strain (CARDIA)    Difficulty of Paying Living Expenses: Not hard at all  Food Insecurity: No Food Insecurity (09/11/2023)   Hunger Vital Sign    Worried About Running Out of Food in the Last Year: Never true    Ran Out of Food in the Last Year: Never true  Transportation Needs: No Transportation Needs (09/11/2023)   PRAPARE - Administrator, Civil Service (Medical): No    Lack of Transportation (Non-Medical): No  Physical Activity: Inactive (09/11/2023)   Exercise Vital Sign    Days of Exercise per Week: 0 days    Minutes of Exercise per Session: 0 min  Stress: No Stress Concern Present (09/11/2023)    [employer] of Occupational Health - Occupational Stress Questionnaire    Feeling of Stress : Only a little  Social Connections: Moderately Integrated (09/11/2023)   Social Connection and Isolation Panel [NHANES]    Frequency of Communication with Friends and Family: Twice a week    Frequency of Social Gatherings with Friends and Family: Once a week    Attends Religious Services: More than 4 times per year    Active Member of [employer] or Organizations: Yes    Attends Engineer, Structural: More than 4 times per year    Marital Status: Divorced  Intimate Partner Violence: Not At Risk (05/20/2022)   Humiliation, Afraid, Rape, and Kick questionnaire    Fear of Current or Ex-Partner: No    Emotionally Abused: No    Physically Abused: No    Sexually Abused: No   Social History   Tobacco Use  Smoking Status Some Days   Current packs/day: 0.50   Average packs/day: 0.5 packs/day for 24.0 years (12.0 ttl pk-yrs)   Types: Cigarettes   Start date: 2001  Smokeless Tobacco Never   Social History   Substance and Sexual Activity  Alcohol Use Not Currently    Family History:  Family History  Problem Relation Age of Onset   Diabetes Paternal Grandmother     Past medical history, surgical history, medications, allergies, family history and social history reviewed with patient today and changes made to appropriate areas of the chart.   Review of Systems  Eyes:  Negative for blurred vision and double vision.  Respiratory:  Negative for shortness of breath.   Cardiovascular:  Negative for chest pain, palpitations and leg swelling.  Neurological:  Negative for dizziness and headaches.   All other ROS negative except what is listed above and in the HPI.      Objective:    BP 114/79 (BP Location: Right Arm, Patient Position: Sitting, Cuff Size: Large)   Pulse 85   Temp 97.9 F (36.6 C) (Oral)   Wt 224 lb 12.8 oz (102 kg)   SpO2 95%   BMI 36.28 kg/m   Wt Readings from Last  3 Encounters:  11/11/23 224 lb 12.8 oz (102 kg)  11/10/23 227 lb 12.8 oz (103.3 kg)  09/11/23 222 lb 6.4 oz (100.9 kg)    Physical Exam Vitals and nursing note reviewed. Exam conducted with a chaperone present Montie Edison, CMA).  Constitutional:      General: She is awake. She is not in  acute distress.    Appearance: Normal appearance. She is well-developed. She is obese. She is not ill-appearing.  HENT:     Head: Normocephalic and atraumatic.     Right Ear: Hearing, tympanic membrane, ear canal and external ear normal. No drainage.     Left Ear: Hearing, tympanic membrane, ear canal and external ear normal. No drainage.     Nose: Nose normal.     Right Sinus: No maxillary sinus tenderness or frontal sinus tenderness.     Left Sinus: No maxillary sinus tenderness or frontal sinus tenderness.     Mouth/Throat:     Mouth: Mucous membranes are moist.     Pharynx: Oropharynx is clear. Uvula midline. No pharyngeal swelling, oropharyngeal exudate or posterior oropharyngeal erythema.  Eyes:     General: Lids are normal.        Right eye: No discharge.        Left eye: No discharge.     Extraocular Movements: Extraocular movements intact.     Conjunctiva/sclera: Conjunctivae normal.     Pupils: Pupils are equal, round, and reactive to light.     Visual Fields: Right eye visual fields normal and left eye visual fields normal.  Neck:     Thyroid: No thyromegaly.     Vascular: No carotid bruit.     Trachea: Trachea normal.  Cardiovascular:     Rate and Rhythm: Normal rate and regular rhythm.     Heart sounds: Normal heart sounds. No murmur heard.    No gallop.  Pulmonary:     Effort: Pulmonary effort is normal. No accessory muscle usage or respiratory distress.     Breath sounds: Normal breath sounds.  Chest:  Breasts:    Right: Normal.     Left: Normal.  Abdominal:     General: Bowel sounds are normal.     Palpations: Abdomen is soft. There is no hepatomegaly or splenomegaly.      Tenderness: There is no abdominal tenderness.  Genitourinary:    Exam position: Knee-chest position.     Vagina: Normal.     Cervix: Normal.  Musculoskeletal:        General: Normal range of motion.     Cervical back: Normal range of motion and neck supple.     Right lower leg: No edema.     Left lower leg: No edema.  Lymphadenopathy:     Head:     Right side of head: No submental, submandibular, tonsillar, preauricular or posterior auricular adenopathy.     Left side of head: No submental, submandibular, tonsillar, preauricular or posterior auricular adenopathy.     Cervical: No cervical adenopathy.     Upper Body:     Right upper body: No supraclavicular, axillary or pectoral adenopathy.     Left upper body: No supraclavicular, axillary or pectoral adenopathy.  Skin:    General: Skin is warm and dry.     Capillary Refill: Capillary refill takes less than 2 seconds.     Findings: No rash.  Neurological:     Mental Status: She is alert and oriented to person, place, and time.     Gait: Gait is intact.  Psychiatric:        Attention and Perception: Attention normal.        Mood and Affect: Mood normal.        Speech: Speech normal.        Behavior: Behavior normal. Behavior is cooperative.  Thought Content: Thought content normal.        Judgment: Judgment normal.     Results for orders placed or performed in visit on 09/11/23  Microalbumin, Urine Waived   Collection Time: 09/11/23 11:28 AM  Result Value Ref Range   Microalb, Ur Waived 80 (H) 0 - 19 mg/L   Creatinine, Urine Waived 200 10 - 300 mg/dL   Microalb/Creat Ratio 30-300 (H) <30 mg/g  Comp Met (CMET)   Collection Time: 09/11/23 11:31 AM  Result Value Ref Range   Glucose 101 (H) 70 - 99 mg/dL   BUN 8 6 - 24 mg/dL   Creatinine, Ser 9.05 0.57 - 1.00 mg/dL   eGFR 77 >40 fO/fpw/8.26   BUN/Creatinine Ratio 9 9 - 23   Sodium 144 134 - 144 mmol/L   Potassium 3.9 3.5 - 5.2 mmol/L   Chloride 108 (H) 96 -  106 mmol/L   CO2 22 20 - 29 mmol/L   Calcium  9.3 8.7 - 10.2 mg/dL   Total Protein 6.8 6.0 - 8.5 g/dL   Albumin 4.2 3.9 - 4.9 g/dL   Globulin, Total 2.6 1.5 - 4.5 g/dL   Bilirubin Total 0.3 0.0 - 1.2 mg/dL   Alkaline Phosphatase 64 44 - 121 IU/L   AST 19 0 - 40 IU/L   ALT 18 0 - 32 IU/L  HgB A1c   Collection Time: 09/11/23 11:31 AM  Result Value Ref Range   Hgb A1c MFr Bld 6.7 (H) 4.8 - 5.6 %   Est. average glucose Bld gHb Est-mCnc 146 mg/dL  Lipid Profile   Collection Time: 09/11/23 11:31 AM  Result Value Ref Range   Cholesterol, Total 194 100 - 199 mg/dL   Triglycerides 95 0 - 149 mg/dL   HDL 54 >60 mg/dL   VLDL Cholesterol Cal 17 5 - 40 mg/dL   LDL Chol Calc (NIH) 876 (H) 0 - 99 mg/dL   Chol/HDL Ratio 3.6 0.0 - 4.4 ratio      Assessment & Plan:   Problem List Items Addressed This Visit       Endocrine   Type 2 diabetes mellitus with other specified complication, without long-term current use of insulin (HCC)   Relevant Orders   HgB A1c     Other   Declined smoking cessation   Discussed cessation and available treatment including nicotine replacement options, pharmacologic treatment, and/or online resources. Based on our discussion, she does not want to initiate treatment today. Plans to initiate tx w/ None. Total time spent on discussion: 3 minutes.        Other Visit Diagnoses       Annual physical exam    -  Primary   Health maintenance reviewed during visit today.  Labs ordered.  Mammogram ordered.  PAP done.   Relevant Orders   CBC with Differential/Platelet   Comprehensive metabolic panel   Lipid panel   TSH   Urinalysis, Routine w reflex microscopic   Cytology - PAP   HgB A1c     Encounter for screening mammogram for malignant neoplasm of breast       Relevant Orders   MM 3D SCREENING MAMMOGRAM BILATERAL BREAST     Screening for ischemic heart disease       Relevant Orders   Lipid panel        Follow up plan: No follow-ups on  file.   LABORATORY TESTING:  - Pap smear: pap done  IMMUNIZATIONS:   - Tdap: Tetanus vaccination status  reviewed: last tetanus booster within 10 years. - Influenza: Up to date - Pneumovax: Not applicable - Prevnar: Not applicable - COVID: Not applicable - HPV: Not applicable - Shingrix vaccine: Not applicable  SCREENING: -Mammogram: Ordered today  - Colonoscopy: Not applicable  - Bone Density: Not applicable  -Hearing Test: Not applicable  -Spirometry: Not applicable   PATIENT COUNSELING:   Advised to take 1 mg of folate supplement per day if capable of pregnancy.   Sexuality: Discussed sexually transmitted diseases, partner selection, use of condoms, avoidance of unintended pregnancy  and contraceptive alternatives.   Advised to avoid cigarette smoking.  I discussed with the patient that most people either abstain from alcohol or drink within safe limits (<=14/week and <=4 drinks/occasion for males, <=7/weeks and <= 3 drinks/occasion for females) and that the risk for alcohol disorders and other health effects rises proportionally with the number of drinks per week and how often a drinker exceeds daily limits.  Discussed cessation/primary prevention of drug use and availability of treatment for abuse.   Diet: Encouraged to adjust caloric intake to maintain  or achieve ideal body weight, to reduce intake of dietary saturated fat and total fat, to limit sodium intake by avoiding high sodium foods and not adding table salt, and to maintain adequate dietary potassium and calcium  preferably from fresh fruits, vegetables, and low-fat dairy products.    stressed the importance of regular exercise  Injury prevention: Discussed safety belts, safety helmets, smoke detector, smoking near bedding or upholstery.   Dental health: Discussed importance of regular tooth brushing, flossing, and dental visits.    NEXT PREVENTATIVE PHYSICAL DUE IN 1 YEAR. No follow-ups on  file.

## 2023-11-11 NOTE — Patient Instructions (Signed)
 Please call to schedule your mammogram and/or bone density: [facility] at [facility]  Address: [address], [place], [place] [address] Phone: [phone]  [facility] at [facility] [address] [place],  [place]  [address] Phone: [phone]

## 2023-11-11 NOTE — Assessment & Plan Note (Signed)
 Discussed cessation and available treatment including nicotine replacement options, pharmacologic treatment, and/or online resources. Based on our discussion, she does not want to initiate treatment today. Plans to initiate tx w/ None. Total time spent on discussion: 3 minutes.

## 2023-11-11 NOTE — Addendum Note (Signed)
 Addended by: Larae Grooms on: 11/11/2023 11:23 AM   Modules accepted: Level of Service

## 2023-11-12 LAB — COMPREHENSIVE METABOLIC PANEL
ALT: 17 [IU]/L (ref 0–32)
AST: 18 [IU]/L (ref 0–40)
Albumin: 4.2 g/dL (ref 3.9–4.9)
Alkaline Phosphatase: 57 [IU]/L (ref 44–121)
BUN/Creatinine Ratio: 9 (ref 9–23)
BUN: 8 mg/dL (ref 6–24)
Bilirubin Total: 0.5 mg/dL (ref 0.0–1.2)
CO2: 22 mmol/L (ref 20–29)
Calcium: 9.4 mg/dL (ref 8.7–10.2)
Chloride: 101 mmol/L (ref 96–106)
Creatinine, Ser: 0.85 mg/dL (ref 0.57–1.00)
Globulin, Total: 2.5 g/dL (ref 1.5–4.5)
Glucose: 198 mg/dL — ABNORMAL HIGH (ref 70–99)
Potassium: 4.3 mmol/L (ref 3.5–5.2)
Sodium: 142 mmol/L (ref 134–144)
Total Protein: 6.7 g/dL (ref 6.0–8.5)
eGFR: 87 mL/min/{1.73_m2} (ref >59)

## 2023-11-12 LAB — CBC WITH DIFFERENTIAL/PLATELET
Basophils Absolute: 0 10*3/uL (ref 0.0–0.2)
Basos: 1 %
EOS (ABSOLUTE): 0 10*3/uL (ref 0.0–0.4)
Eos: 1 %
Hematocrit: 40.5 % (ref 34.0–46.6)
Hemoglobin: 13.1 g/dL (ref 11.1–15.9)
Immature Grans (Abs): 0 10*3/uL (ref 0.0–0.1)
Immature Granulocytes: 0 %
Lymphocytes Absolute: 1.3 10*3/uL (ref 0.7–3.1)
Lymphs: 26 %
MCH: 29.6 pg (ref 26.6–33.0)
MCHC: 32.3 g/dL (ref 31.5–35.7)
MCV: 92 fL (ref 79–97)
Monocytes Absolute: 0.4 10*3/uL (ref 0.1–0.9)
Monocytes: 8 %
Neutrophils Absolute: 3.3 10*3/uL (ref 1.4–7.0)
Neutrophils: 64 %
Platelets: 274 10*3/uL (ref 150–450)
RBC: 4.42 x10E6/uL (ref 3.77–5.28)
RDW: 12.6 % (ref 11.7–15.4)
WBC: 5.1 10*3/uL (ref 3.4–10.8)

## 2023-11-12 LAB — LIPID PANEL
Chol/HDL Ratio: 3.5 {ratio} (ref 0.0–4.4)
Cholesterol, Total: 219 mg/dL — ABNORMAL HIGH (ref 100–199)
HDL: 62 mg/dL (ref >39)
LDL Chol Calc (NIH): 140 mg/dL — ABNORMAL HIGH (ref 0–99)
Triglycerides: 97 mg/dL (ref 0–149)
VLDL Cholesterol Cal: 17 mg/dL (ref 5–40)

## 2023-11-12 LAB — TSH: TSH: 0.982 u[IU]/mL (ref 0.450–4.500)

## 2023-11-12 LAB — HEMOGLOBIN A1C
Est. average glucose Bld gHb Est-mCnc: 140 mg/dL
Hgb A1c MFr Bld: 6.5 % — ABNORMAL HIGH (ref 4.8–5.6)

## 2023-11-13 LAB — CYTOLOGY - PAP
Adequacy: ABSENT
Diagnosis: NEGATIVE

## 2023-11-16 MED ORDER — METRONIDAZOLE 500 MG PO TABS: 500.0000 mg | ORAL_TABLET | Freq: Two times a day (BID) | ORAL | 0 refills | Status: AC

## 2023-11-16 NOTE — Addendum Note (Signed)
 Addended by: Larae Grooms on: 11/16/2023 07:58 AM   Modules accepted: Orders

## 2023-12-09 ENCOUNTER — Ambulatory Visit
Admission: EM | Admit: 2023-12-09 | Discharge: 2023-12-09 | Disposition: A | Discharge status: Home / Self Care | Payer: MEDICAID | Attending: Emergency Medicine | Admitting: Emergency Medicine

## 2023-12-09 DIAGNOSIS — J069 Acute upper respiratory infection, unspecified: Secondary | ICD-10-CM

## 2023-12-09 LAB — POCT RAPID STREP A (OFFICE): Rapid Strep A Screen: NEGATIVE

## 2023-12-09 LAB — POC COVID19/FLU A&B COMBO
Covid Antigen, POC: NEGATIVE
Influenza A Antigen, POC: NEGATIVE
Influenza B Antigen, POC: NEGATIVE

## 2023-12-09 MED ORDER — BENZONATATE 100 MG PO CAPS: 100.0000 mg | ORAL_CAPSULE | Freq: Three times a day (TID) | ORAL | 0 refills | Status: DC

## 2023-12-09 MED ORDER — LIDOCAINE VISCOUS HCL 2 % MT SOLN: 15.0000 mL | OROMUCOSAL | 0 refills | Status: DC | PRN

## 2023-12-09 MED ORDER — PROMETHAZINE-DM 6.25-15 MG/5ML PO SYRP: 5.0000 mL | ORAL_SOLUTION | Freq: Every evening | ORAL | 0 refills | Status: DC | PRN

## 2023-12-09 NOTE — ED Triage Notes (Signed)
Patient presents to UC for HA, sore throat, and chills since yesterday. Treating symptoms with dayquil. Last known fever yesterday night.

## 2023-12-09 NOTE — ED Provider Notes (Signed)
 [facility]    CSN: [id number] Arrival date & time: 12/09/23  1025      History   Chief Complaint Chief Complaint  Patient presents with   Sore Throat   Chills   Headache    HPI Christina Cochran is a 43 y.o. female.   Patient presents for evaluation of chills, body aches, intermittent headaches exacerbated by coughing, productive cough present for 1 day.  Known exposure to influenza at work.  Denies shortness of breath, wheezing.  Poor appetite but tolerating some food and liquids.  Has attempted use of DayQuil.  Past Medical History:  Diagnosis Date   Diabetes mellitus without complication [facility])     Patient Active Problem List   Diagnosis Date Noted   Declined smoking cessation 11/11/2023   Obesity, morbid (HCC) 11/10/2023   Type 2 diabetes mellitus with other specified complication, without long-term current use of insulin (HCC) 09/11/2023   Substance abuse in remission (HCC) 01/25/2022   Cocaine use 03/26/2021   Alcohol use 03/26/2021   Marijuana use 03/26/2021    Past Surgical History:  Procedure Laterality Date   CESAREAN SECTION     times two    TUBAL LIGATION      OB History     Gravida  5   Para  4   Term      Preterm      AB  1   Living  4      SAB      IAB  1   Ectopic      Multiple      Live Births               Home Medications    Prior to Admission medications   Medication Sig Start Date End Date Taking? Authorizing Provider  benzonatate  (TESSALON ) 100 MG capsule Take 1 capsule (100 mg total) by mouth every 8 (eight) hours. 12/09/23  Yes Amina Menchaca R, NP  lidocaine  (XYLOCAINE ) 2 % solution Use as directed 15 mLs in the mouth or throat as needed. 12/09/23  Yes Kennadie Brenner, Shelba JONELLE, NP  promethazine -dextromethorphan (PROMETHAZINE -DM) 6.25-15 MG/5ML syrup Take 5 mLs by mouth at bedtime as needed. 12/09/23  Yes Sianne Tejada R, NP  cariprazine (VRAYLAR) 1.5 MG capsule Take by mouth. Take one tablet by mouth daily for  mood    [provider]  lisinopril  (ZESTRIL ) 2.5 MG tablet Take 1 tablet (2.5 mg total) by mouth daily. 11/10/23   Melvin Pao, NP  naltrexone (DEPADE) 50 MG tablet Take by mouth daily. Take one tablet by mouth daily at the same time everyday    [provider]  rosuvastatin  (CRESTOR ) 5 MG tablet Take 1 tablet (5 mg total) by mouth daily. 11/10/23   Melvin Pao, NP  traZODone (DESYREL) 100 MG tablet Take 100 mg by mouth at bedtime. Take one tablet by mouth at bedtime as needed for sleep    [provider]    Family History Family History  Problem Relation Age of Onset   Diabetes Paternal Grandmother     Social History Social History   Tobacco Use   Smoking status: Some Days    Current packs/day: 0.50    Average packs/day: 0.5 packs/day for 24.1 years (12.0 ttl pk-yrs)    Types: Cigarettes    Start date: 2001   Smokeless tobacco: Never  Vaping Use   Vaping status: Former  Substance Use Topics   Alcohol use: Not Currently   Drug use:  Not Currently    Types: Cocaine, Marijuana, Crack cocaine    Comment: no use since 08/24/2021     Allergies   Patient has no known allergies.   Review of Systems Review of Systems   Physical Exam Triage Vital Signs ED Triage Vitals  Encounter Vitals Group     BP 12/09/23 1215 108/77     Systolic BP Percentile --      Diastolic BP Percentile --      Pulse Rate 12/09/23 1215 87     Resp 12/09/23 1215 17     Temp 12/09/23 1215 99.7 F (37.6 C)     Temp Source 12/09/23 1215 Oral     SpO2 12/09/23 1215 93 %     Weight --      Height --      Head Circumference --      Peak Flow --      Pain Score 12/09/23 1156 5     Pain Loc --      Pain Education --      Exclude from Growth Chart --    No data found.  Updated Vital Signs BP 108/77 (BP Location: Right Arm)   Pulse 87   Temp 99.7 F (37.6 C) (Oral)   Resp 17   LMP 11/26/2023 (Approximate)   SpO2 93%   Visual Acuity Right Eye  Distance:   Left Eye Distance:   Bilateral Distance:    Right Eye Near:   Left Eye Near:    Bilateral Near:     Physical Exam Constitutional:      Appearance: She is ill-appearing.  HENT:     Head: Normocephalic.     Right Ear: Tympanic membrane, ear canal and external ear normal.     Left Ear: Tympanic membrane, ear canal and external ear normal.     Nose: Congestion present. No rhinorrhea.     Mouth/Throat:     Mouth: Mucous membranes are moist.     Pharynx: Oropharynx is clear. No posterior oropharyngeal erythema.  Eyes:     Extraocular Movements: Extraocular movements intact.  Cardiovascular:     Rate and Rhythm: Normal rate and regular rhythm.     Pulses: Normal pulses.     Heart sounds: Normal heart sounds.  Pulmonary:     Effort: Pulmonary effort is normal.     Breath sounds: Normal breath sounds.  Musculoskeletal:     Cervical back: Normal range of motion and neck supple.  Neurological:     Mental Status: She is alert and oriented to person, place, and time.      UC Treatments / Results  Labs (all labs ordered are listed, but only abnormal results are displayed) Labs Reviewed  POC COVID19/FLU A&B COMBO - Normal  POCT RAPID STREP A (OFFICE) - Normal    EKG   Radiology No results found.  Procedures Procedures (including critical care time)  Medications Ordered in UC Medications - No data to display  Initial Impression / Assessment and Plan / UC Course  I have reviewed the triage vital signs and the nursing notes.  Pertinent labs & imaging results that were available during my care of the patient were reviewed by me and considered in my medical decision making (see chart for details).  Viral URI with cough  Patient is in no signs of distress nor toxic appearing.  Vital signs are stable.  Low suspicion for pneumonia, pneumothorax or bronchitis and therefore will defer imaging.  COVID, flu and  strep test negative.  Prescribed Tessalon , Promethazine  DM  and viscous lidocaine .May use additional over-the-counter medications as needed for supportive care.  May follow-up with urgent care as needed if symptoms persist or worsen.  Note given.   Final Clinical Impressions(s) / UC Diagnoses   Final diagnoses:  Viral URI with cough     Discharge Instructions      Your symptoms today are most likely being caused by a virus and should steadily improve in time it can take up to 7 to 10 days before you truly start to see a turnaround however things will get better  Other, flu and strep test negative.  You may use Tessalon  pill every 8 hours for your cough, may use cough syrup at bedtime for additional comfort and to allow for rest  May gargle and spit lidocaine  solution as needed to provide temporary relief to your throat    You can take Tylenol  and/or Ibuprofen  as needed for fever reduction and pain relief.   For cough: honey 1/2 to 1 teaspoon (you can dilute the honey in water or another fluid).  You can also use guaifenesin and dextromethorphan for cough. You can use a humidifier for chest congestion and cough.  If you don't have a humidifier, you can sit in the bathroom with the hot shower running.      For sore throat: try warm salt water gargles, cepacol lozenges, throat spray, warm tea or water with lemon/honey, popsicles or ice, or OTC cold relief medicine for throat discomfort.   For congestion: take a daily anti-histamine like Zyrtec, Claritin, and a oral decongestant, such as pseudoephedrine.  You can also use Flonase 1-2 sprays in each nostril daily.   It is important to stay hydrated: drink plenty of fluids (water, gatorade/powerade/pedialyte, juices, or teas) to keep your throat moisturized and help further relieve irritation/discomfort.    ED Prescriptions     Medication Sig Dispense Auth. Provider   benzonatate  (TESSALON ) 100 MG capsule Take 1 capsule (100 mg total) by mouth every 8 (eight) hours. 21 capsule Cheyla Duchemin R,  NP   promethazine -dextromethorphan (PROMETHAZINE -DM) 6.25-15 MG/5ML syrup Take 5 mLs by mouth at bedtime as needed. 118 mL Christo Hain R, NP   lidocaine  (XYLOCAINE ) 2 % solution Use as directed 15 mLs in the mouth or throat as needed. 100 mL Teresa Shelba SAUNDERS, NP      PDMP not reviewed this encounter.   Teresa Shelba SAUNDERS, NP 12/09/23 1302

## 2023-12-09 NOTE — Discharge Instructions (Signed)
 Your symptoms today are most likely being caused by a virus and should steadily improve in time it can take up to 7 to 10 days before you truly start to see a turnaround however things will get better  Other, flu and strep test negative.  You may use Tessalon  pill every 8 hours for your cough, may use cough syrup at bedtime for additional comfort and to allow for rest  May gargle and spit lidocaine  solution as needed to provide temporary relief to your throat    You can take Tylenol  and/or Ibuprofen  as needed for fever reduction and pain relief.   For cough: honey 1/2 to 1 teaspoon (you can dilute the honey in water or another fluid).  You can also use guaifenesin and dextromethorphan for cough. You can use a humidifier for chest congestion and cough.  If you don't have a humidifier, you can sit in the bathroom with the hot shower running.      For sore throat: try warm salt water gargles, cepacol lozenges, throat spray, warm tea or water with lemon/honey, popsicles or ice, or OTC cold relief medicine for throat discomfort.   For congestion: take a daily anti-histamine like Zyrtec, Claritin, and a oral decongestant, such as pseudoephedrine.  You can also use Flonase 1-2 sprays in each nostril daily.   It is important to stay hydrated: drink plenty of fluids (water, gatorade/powerade/pedialyte, juices, or teas) to keep your throat moisturized and help further relieve irritation/discomfort.

## 2024-01-18 ENCOUNTER — Ambulatory Visit: Payer: Self-pay | Admitting: Nurse Practitioner

## 2024-01-18 ENCOUNTER — Ambulatory Visit (INDEPENDENT_AMBULATORY_CARE_PROVIDER_SITE_OTHER): Payer: MEDICAID | Admitting: Nurse Practitioner

## 2024-01-18 ENCOUNTER — Encounter: Payer: Self-pay | Admitting: Nurse Practitioner

## 2024-01-18 VITALS — BP 125/81 | HR 64 | Temp 98.0°F | Wt 228.0 lb

## 2024-01-18 DIAGNOSIS — B9689 Other specified bacterial agents as the cause of diseases classified elsewhere: Secondary | ICD-10-CM | POA: Diagnosis not present

## 2024-01-18 DIAGNOSIS — N760 Acute vaginitis: Secondary | ICD-10-CM | POA: Insufficient documentation

## 2024-01-18 LAB — WET PREP FOR TRICH, YEAST, CLUE
Clue Cell Exam: POSITIVE — AB
Trichomonas Exam: NEGATIVE
Yeast Exam: NEGATIVE

## 2024-01-18 MED ORDER — FLUCONAZOLE 150 MG PO TABS: 150.0000 mg | ORAL_TABLET | Freq: Every day | ORAL | 0 refills | Status: DC

## 2024-01-18 MED ORDER — METRONIDAZOLE 0.75 % VA GEL: 1.0000 | Freq: Every day | VAGINAL | 0 refills | Status: AC

## 2024-01-18 NOTE — Assessment & Plan Note (Signed)
 Acute, last infection in January treated with oral Flagyl.  Educated patient on this.  Wet prep today positive for clue cells and negative for yeast and trich.  Will trial vaginal Flagyl treatment daily for 7 days.  Send in one dose of Diflucan to take as needed.  Recommend ensuring clean hands of partner with any stimulation.

## 2024-01-18 NOTE — Progress Notes (Signed)
 BP 125/81   Pulse 64   Temp 98 F (36.7 C) (Oral)   Wt 228 lb (103.4 kg)   SpO2 99%   BMI 36.80 kg/m    Subjective:    Patient ID: Christina Cochran, female    DOB: 01-07-1981, 43 y.o.   MRN: [id number]  HPI: Christina Cochran is a 43 y.o. female  Chief Complaint  Patient presents with   Vaginal Itching    Patient states she has been having vaginal itching for the last month.    VAGINAL DISCHARGE Started in January after treatment with oral Flagyl. Duration: months Discharge description: mucous  Pruritus: yes Dysuria: no Malodorous: yes Urinary frequency: no Fevers: no Abdominal pain: no  Sexual activity: monogamous History of sexually transmitted diseases: yes - years ago Recent antibiotic use: in January Context: Recent BV   Treatments attempted: a cream she got from UC in past and triple abx cream   Relevant past medical, surgical, family and social history reviewed and updated as indicated. Interim medical history since our last visit reviewed. Allergies and medications reviewed and updated.  Review of Systems  Constitutional:  Negative for activity change, appetite change, diaphoresis, fatigue and fever.  Respiratory:  Negative for cough, chest tightness and shortness of breath.   Cardiovascular:  Negative for chest pain, palpitations and leg swelling.  Gastrointestinal: Negative.   Genitourinary:  Negative for decreased urine volume, difficulty urinating, dysuria, frequency, urgency, vaginal bleeding, vaginal discharge and vaginal pain.  Neurological: Negative.   Psychiatric/Behavioral: Negative.     Per HPI unless specifically indicated above     Objective:    BP 125/81   Pulse 64   Temp 98 F (36.7 C) (Oral)   Wt 228 lb (103.4 kg)   SpO2 99%   BMI 36.80 kg/m   Wt Readings from Last 3 Encounters:  01/18/24 228 lb (103.4 kg)  11/11/23 224 lb 12.8 oz (102 kg)  11/10/23 227 lb 12.8 oz (103.3 kg)    Physical Exam Vitals and nursing note reviewed.   Constitutional:      General: She is awake. She is not in acute distress.    Appearance: She is well-developed and well-groomed. She is obese. She is not ill-appearing or toxic-appearing.  HENT:     Head: Normocephalic.     Right Ear: Hearing and external ear normal.     Left Ear: Hearing and external ear normal.  Eyes:     General: Lids are normal.        Right eye: No discharge.        Left eye: No discharge.     Conjunctiva/sclera: Conjunctivae normal.     Pupils: Pupils are equal, round, and reactive to light.  Neck:     Thyroid: No thyromegaly.     Vascular: No carotid bruit.  Cardiovascular:     Rate and Rhythm: Normal rate and regular rhythm.     Heart sounds: Normal heart sounds. No murmur heard.    No gallop.  Pulmonary:     Effort: Pulmonary effort is normal. No accessory muscle usage or respiratory distress.     Breath sounds: Normal breath sounds.  Abdominal:     General: Bowel sounds are normal. There is no distension.     Palpations: Abdomen is soft.     Tenderness: There is no abdominal tenderness.  Musculoskeletal:     Cervical back: Normal range of motion and neck supple.     Right lower leg: No edema.  Left lower leg: No edema.  Lymphadenopathy:     Cervical: No cervical adenopathy.  Skin:    General: Skin is warm and dry.  Neurological:     Mental Status: She is alert and oriented to person, place, and time.     Deep Tendon Reflexes: Reflexes are normal and symmetric.     Reflex Scores:      Brachioradialis reflexes are 2+ on the right side and 2+ on the left side.      Patellar reflexes are 2+ on the right side and 2+ on the left side. Psychiatric:        Attention and Perception: Attention normal.        Mood and Affect: Mood normal.        Speech: Speech normal.        Behavior: Behavior normal. Behavior is cooperative.        Thought Content: Thought content normal.    Results for orders placed or performed during the hospital encounter of  12/09/23  POC Covid19/Flu A&B Antigen   Collection Time: 12/09/23 12:18 PM  Result Value Ref Range   Influenza A Antigen, POC Negative    Influenza B Antigen, POC Negative    Covid Antigen, POC Negative   POCT rapid strep A   Collection Time: 12/09/23 12:18 PM  Result Value Ref Range   Rapid Strep A Screen Negative       Assessment & Plan:   Problem List Items Addressed This Visit       Genitourinary   Bacterial vaginosis - Primary   Acute, last infection in January treated with oral Flagyl.  Educated patient on this.  Wet prep today positive for clue cells and negative for yeast and trich.  Will trial vaginal Flagyl treatment daily for 7 days.  Send in one dose of Diflucan to take as needed.  Recommend ensuring clean hands of partner with any stimulation.        Relevant Medications   fluconazole (DIFLUCAN) 150 MG tablet   Other Relevant Orders   WET PREP FOR TRICH, YEAST, CLUE     Follow up plan: Return if symptoms worsen or fail to improve.

## 2024-01-18 NOTE — Telephone Encounter (Signed)
  Chief Complaint: vaginal itching Symptoms: itching, pain Frequency: ongoing since January Pertinent Negatives: Patient denies bood in urine, injury, abd pain Disposition: [] ED /[] Urgent Care (no appt availability in office) / [x] Appointment(In office/virtual)/ []  [facility]/ [] Home Care/ [] Refused Recommended Disposition /[] [facility]/ []  Follow-up with PCP Additional Notes: Patient calls reporting ongoing vaginal itching that has worsened today. She states she has had a yeast infection recently, but feels she has scratched her labia raw. Per protocol, patient to be evaluated within 24 hours. First available appointment with PCP outside of guideline. Patient scheduled with first available provider in clinic for today at 1540. Care advice reviewed, patient verbalized understanding and denies further questions at this time. Alerting PCP for review.    Reason for Disposition  MODERATE-SEVERE itching (i.e., interferes with school, work, or sleep)  Answer Assessment - Initial Assessment Questions 1. SYMPTOM: "What's the main symptom you're concerned about?" (e.g., pain, itching, dryness)     itching 2. LOCATION: "Where is the  symptoms located?" (e.g., inside/outside, left/right)     States it has been ongoing since January, outside labia 3. ONSET: "When did the  itching  start?"     January 4. PAIN: "Is there any pain?" If Yes, ask: "How bad is it?" (Scale: 1-10; mild, moderate, severe)   -  MILD (1-3): Doesn't interfere with normal activities.    -  MODERATE (4-7): Interferes with normal activities (e.g., work or school) or awakens from sleep.     -  SEVERE (8-10): Excruciating pain, unable to do any normal activities.     Raw, 7/10 5. ITCHING: "Is there any itching?" If Yes, ask: "How bad is it?" (Scale: 1-10; mild, moderate, severe)     10/10 6. CAUSE: "What do you think is causing the discharge?" "Have you had the same problem before? What happened then?"      Denies discharge, yes, has had in the past. 7. OTHER SYMPTOMS: "Do you have any other symptoms?" (e.g., fever, itching, vaginal bleeding, pain with urination, injury to genital area, vaginal foreign body)     Scratched raw from itching 8. PREGNANCY: "Is there any chance you are pregnant?" "When was your last menstrual period?"     Denies  Protocols used: Vaginal Symptoms-A-AH

## 2024-01-18 NOTE — Telephone Encounter (Signed)
 Contact attempt #1. Automessage received: this person has a voicemail that has not been set up yet, you cannot leave a message at this time goodbye.   Message from Alessandra Bevels sent at 01/18/2024  2:23 PM EDT  Summary: Rash Advice   Copied From CRM [id number]. Reason for Triage: Pt is calling to report that she was given metroNIDAZOLE (FLAGYL) 500 MG tablet [[id number]]  ENDED 11/16/23. Patient is reporting itching with rash,

## 2024-01-18 NOTE — Patient Instructions (Signed)
 Vaginal Infection (Bacterial Vaginosis): What to Know  Bacterial vaginosis is an infection of the vagina. It happens when the balance of normal germs (bacteria) in the vagina changes. If you don't get treated, it can make it easier for you to get other infections from sex. These are called sexually transmitted infections (STIs). If you're pregnant, you need to get treated right away. This infection can cause a baby to be born early or at a low birth weight. What are the causes? This infection happens when too many harmful germs grow in the vagina. You can't get this infection from toilet seats, bedsheets, swimming pools, or things that touch your vagina. What increases the risk? Having sex with a new person or more than one person. Having sex without protection. Douching. Having an intrauterine device (IUD). Smoking. Using drugs or drinking alcohol. These can lead you to do risky things. Taking certain antibiotics. Being pregnant. What are the signs or symptoms? Some females have no symptoms. Symptoms may include: A gray or white discharge from your vagina. It can be watery or foamy. A fishy smell. This can happen after sex or during your menstrual period. Itching in and around your vagina. Burning or pain when you pee. How is this treated? This infection is treated with antibiotics. These may be given to you as: A pill. A cream for your vagina. A medicine that you put into your vagina (suppository). If the infection comes back, you may need more antibiotics. Follow these instructions at home: Medicines Take your medicines as told. Take or use your antibiotics as told. Do not stop using them even if you start to feel better. General instructions If the person you have sex with is a female, tell her that you have this infection. She will need to follow up with her doctor. Female partners don't need to be treated. Do not have sex until you finish treatment. Drink more fluids as  told. Keep your vagina and butt clean. Wash these areas with warm water each day. Wipe from front to back after you poop. If you're breastfeeding a baby, talk to your doctor if you should keep doing so during treatment. How is this prevented? Self-care Do not douche. Do not use deodorant sprays on your vagina. Wear cotton underwear. Do not wear tight pants and pantyhose, especially in the summer. Safe sex Use condoms the correct way and every time you have sex. Use dental dams to protect yourself during oral sex. Limit how many people you have sex with. Get tested for STIs. The person you have sex with should also get tested. Drugs and alcohol Do not smoke, vape, or use nicotine or tobacco. Do not use drugs. Limit the amount of alcohol you drink because it can lead you to do risky things. Where to find more information To learn more: Go to TonerPromos.no. Click Health Topics A-Z. Type "bacterial vaginosis" in the search bar. American Sexual Health Association (ASHA): ashasexualhealth.org U.S. Department of Health and [employer], Office on Women's Health: TravelLesson.ca Contact a doctor if: Your symptoms don't get better, even after treatment. You have more discharge or pain when you pee. You have a fever or chills. You have pain in your belly or in the area between your hips. You have pain during sex. You bleed from your vagina between menstrual periods. This information is not intended to replace advice given to you by your health care provider. Make sure you discuss any questions you have with your health care provider. Document  Revised: 04/08/2023 Document Reviewed: 04/08/2023 Elsevier Patient Education  2024 [employer].

## 2024-02-23 ENCOUNTER — Telehealth: Payer: Self-pay

## 2024-02-23 NOTE — Telephone Encounter (Signed)
 Call pt re positive syphilis result with [facility]: 02/16/24 specimen, Plasma, STS test, Reactive-preliminary, result dated 02/19/24  Counsel, discuss testing at [facility] for confirmatory.  (In Epic, last syphilis 05/20/22 = non reactive)

## 2024-02-24 NOTE — Telephone Encounter (Signed)
 Received call from Suzy Esters, DIS. Pt answered her call. Pt informed Darlina Einstein that she already has an appt with [facility] tomorrow, 02/25/24. (See scheduled appt in Epic.)  Tiera counseled pt on the specific tests they need to run, including the confirmatory TP.

## 2024-02-25 ENCOUNTER — Encounter: Payer: Self-pay | Admitting: Nurse Practitioner

## 2024-02-25 ENCOUNTER — Ambulatory Visit (INDEPENDENT_AMBULATORY_CARE_PROVIDER_SITE_OTHER): Payer: MEDICAID | Admitting: Nurse Practitioner

## 2024-02-25 VITALS — BP 105/71 | HR 75 | Temp 98.7°F | Wt 229.6 lb

## 2024-02-25 DIAGNOSIS — Z8619 Personal history of other infectious and parasitic diseases: Secondary | ICD-10-CM | POA: Insufficient documentation

## 2024-02-25 DIAGNOSIS — A530 Latent syphilis, unspecified as early or late: Secondary | ICD-10-CM | POA: Diagnosis not present

## 2024-02-25 DIAGNOSIS — N898 Other specified noninflammatory disorders of vagina: Secondary | ICD-10-CM | POA: Diagnosis not present

## 2024-02-25 LAB — WET PREP FOR TRICH, YEAST, CLUE
Clue Cell Exam: NEGATIVE
Trichomonas Exam: NEGATIVE
Yeast Exam: NEGATIVE

## 2024-02-25 NOTE — Assessment & Plan Note (Signed)
 At [facility] center recently.  Was treated 24 to 25 years.  Discussed with her this could be from past infection.  Will recheck today and reflex to TrepSure.

## 2024-02-25 NOTE — Patient Instructions (Signed)
 Vaginal Infection (Bacterial Vaginosis): What to Know  Bacterial vaginosis is an infection of the vagina. It happens when the balance of normal germs (bacteria) in the vagina changes. If you don't get treated, it can make it easier for you to get other infections from sex. These are called sexually transmitted infections (STIs). If you're pregnant, you need to get treated right away. This infection can cause a baby to be born early or at a low birth weight. What are the causes? This infection happens when too many harmful germs grow in the vagina. You can't get this infection from toilet seats, bedsheets, swimming pools, or things that touch your vagina. What increases the risk? Having sex with a new person or more than one person. Having sex without protection. Douching. Having an intrauterine device (IUD). Smoking. Using drugs or drinking alcohol. These can lead you to do risky things. Taking certain antibiotics. Being pregnant. What are the signs or symptoms? Some females have no symptoms. Symptoms may include: A gray or white discharge from your vagina. It can be watery or foamy. A fishy smell. This can happen after sex or during your menstrual period. Itching in and around your vagina. Burning or pain when you pee. How is this treated? This infection is treated with antibiotics. These may be given to you as: A pill. A cream for your vagina. A medicine that you put into your vagina (suppository). If the infection comes back, you may need more antibiotics. Follow these instructions at home: Medicines Take your medicines as told. Take or use your antibiotics as told. Do not stop using them even if you start to feel better. General instructions If the person you have sex with is a female, tell her that you have this infection. She will need to follow up with her doctor. Female partners don't need to be treated. Do not have sex until you finish treatment. Drink more fluids as  told. Keep your vagina and butt clean. Wash these areas with warm water each day. Wipe from front to back after you poop. If you're breastfeeding a baby, talk to your doctor if you should keep doing so during treatment. How is this prevented? Self-care Do not douche. Do not use deodorant sprays on your vagina. Wear cotton underwear. Do not wear tight pants and pantyhose, especially in the summer. Safe sex Use condoms the correct way and every time you have sex. Use dental dams to protect yourself during oral sex. Limit how many people you have sex with. Get tested for STIs. The person you have sex with should also get tested. Drugs and alcohol Do not smoke, vape, or use nicotine or tobacco. Do not use drugs. Limit the amount of alcohol you drink because it can lead you to do risky things. Where to find more information To learn more: Go to TonerPromos.no. Click Health Topics A-Z. Type "bacterial vaginosis" in the search bar. American Sexual Health Association (ASHA): ashasexualhealth.org U.S. Department of Health and [employer], Office on Women's Health: TravelLesson.ca Contact a doctor if: Your symptoms don't get better, even after treatment. You have more discharge or pain when you pee. You have a fever or chills. You have pain in your belly or in the area between your hips. You have pain during sex. You bleed from your vagina between menstrual periods. This information is not intended to replace advice given to you by your health care provider. Make sure you discuss any questions you have with your health care provider. Document  Revised: 04/08/2023 Document Reviewed: 04/08/2023 Elsevier Patient Education  2024 [employer].

## 2024-02-25 NOTE — Assessment & Plan Note (Signed)
 Acute and ongoing for a few days, recent BV treatment in March.  Check wet prep today and treat as needed.

## 2024-02-25 NOTE — Progress Notes (Signed)
 Contacted via MyChart   Good day Christina Cochran, I have good news this check.  Your wet prep returned all negative:)

## 2024-02-25 NOTE — Progress Notes (Signed)
 BP 105/71   Pulse 75   Temp 98.7 F (37.1 C) (Oral)   Wt 229 lb 9.6 oz (104.1 kg)   SpO2 95%   BMI 37.06 kg/m    Subjective:    Patient ID: Christina Cochran, female    DOB: Aug 18, 1981, 43 y.o.   MRN: [id number]  HPI: Christina Cochran is a 43 y.o. female  Chief Complaint  Patient presents with   STD Labs    Patient states she needs to be tested for Syphllis, states she was informed she tested posted while being screened to donate plasma. No known exposures per patient.    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 Back 24 to 25 years did get treatment for syphilis. Currently has partner and they are not sexually active.  Since last visit in March she has had irritation down below again -- was treated for BV and yeast at the time. Sexual activity:  In a Monogamous Relationship Contraception: no Recent unprotected intercourse: no History of sexually transmitted diseases: yes Previous sexually transmitted disease screening: no Lifetime sexual partners: 10 Genital lesions: no Dysuria: no Swollen lymph nodes: no Fevers: no Rash: no   Relevant past medical, surgical, family and social history reviewed and updated as indicated. Interim medical history since our last visit reviewed. Allergies and medications reviewed and updated.  Review of Systems  Constitutional:  Negative for activity change, appetite change, diaphoresis, fatigue and fever.  Respiratory:  Negative for cough, chest tightness and shortness of breath.   Cardiovascular:  Negative for chest pain, palpitations and leg swelling.  Gastrointestinal: Negative.   Genitourinary:  Positive for vaginal discharge. Negative for dysuria, frequency, genital sores, urgency, vaginal bleeding and vaginal pain.  Skin:  Negative for rash.  Psychiatric/Behavioral: Negative.      Per HPI unless specifically indicated  above     Objective:    BP 105/71   Pulse 75   Temp 98.7 F (37.1 C) (Oral)   Wt 229 lb 9.6 oz (104.1 kg)   SpO2 95%   BMI 37.06 kg/m   Wt Readings from Last 3 Encounters:  02/25/24 229 lb 9.6 oz (104.1 kg)  01/18/24 228 lb (103.4 kg)  11/11/23 224 lb 12.8 oz (102 kg)    Physical Exam Vitals and nursing note reviewed.  Constitutional:      General: She is awake. She is not in acute distress.    Appearance: She is well-developed and well-groomed. She is obese. She is not ill-appearing or toxic-appearing.  HENT:     Head: Normocephalic.     Right Ear: Hearing and external ear normal.     Left Ear: Hearing and external ear normal.  Eyes:     General: Lids are normal.        Right eye: No discharge.        Left eye: No discharge.     Conjunctiva/sclera: Conjunctivae normal.     Pupils: Pupils are equal, round, and reactive to light.  Neck:     Thyroid: No thyromegaly.     Vascular: No carotid bruit.  Cardiovascular:     Rate and Rhythm: Normal rate and regular rhythm.     Heart sounds: Normal heart sounds. No murmur heard.    No gallop.  Pulmonary:     Effort: Pulmonary effort is normal. No accessory muscle usage or respiratory distress.  Breath sounds: Normal breath sounds.  Abdominal:     General: Bowel sounds are normal. There is no distension.     Palpations: Abdomen is soft.     Tenderness: There is no abdominal tenderness.  Musculoskeletal:     Cervical back: Normal range of motion and neck supple.     Right lower leg: No edema.     Left lower leg: No edema.  Lymphadenopathy:     Cervical: No cervical adenopathy.  Skin:    General: Skin is warm and dry.     Comments: No rashes or lesions noted, including to palms.  Neurological:     Mental Status: She is alert and oriented to person, place, and time.     Deep Tendon Reflexes: Reflexes are normal and symmetric.     Reflex Scores:      Brachioradialis reflexes are 2+ on the right side and 2+ on the left  side.      Patellar reflexes are 2+ on the right side and 2+ on the left side. Psychiatric:        Attention and Perception: Attention normal.        Mood and Affect: Mood normal.        Speech: Speech normal.        Behavior: Behavior normal. Behavior is cooperative.        Thought Content: Thought content normal.    Results for orders placed or performed in visit on 01/18/24  WET PREP FOR TRICH, YEAST, CLUE   Collection Time: 01/18/24  3:44 PM   Specimen: Sterile Swab   Sterile Swab  Result Value Ref Range   Trichomonas Exam Negative Negative   Yeast Exam Negative Negative   Clue Cell Exam Positive (A) Negative      Assessment & Plan:   Problem List Items Addressed This Visit       Genitourinary   Vaginal pruritus - Primary   Acute and ongoing for a few days, recent BV treatment in March.  Check wet prep today and treat as needed.      Relevant Orders   WET PREP FOR TRICH, YEAST, CLUE     Other   Positive RPR test   At plasma center recently.  Was treated 24 to 25 years.  Discussed with her this could be from past infection.  Will recheck today and reflex to TrepSure.      Relevant Orders   RPR w/reflex to TrepSure   History of syphilis   Was treated 24 to 25 years.  Recent + RPR at plasma center, discussed with her this could be from past infection.  Will recheck today and reflex to TrepSure.      Relevant Orders   RPR w/reflex to TrepSure     Follow up plan: Return if symptoms worsen or fail to improve.

## 2024-02-25 NOTE — Assessment & Plan Note (Signed)
 Was treated 24 to 25 years.  Recent + RPR at plasma center, discussed with her this could be from past infection.  Will recheck today and reflex to TrepSure.

## 2024-02-26 ENCOUNTER — Telehealth: Payer: Self-pay | Admitting: Nurse Practitioner

## 2024-02-26 LAB — RPR W/REFLEX TO TREPSURE: RPR: NONREACTIVE

## 2024-02-26 LAB — TREPONEMAL ANTIBODIES, TPPA: Treponemal Antibodies, TPPA: REACTIVE — AB

## 2024-02-26 NOTE — Telephone Encounter (Signed)
 Pt kept appt with PCP on 02/25/24. RPR w/reflex to Trepsure ordered. Notes reflect pt has hx of syphilis.  Spoke with Tiera Stackhouse, DIS. Older records accessed: 05/28/1999 =  Neg RPR 06/19/1999 = TRUS positive, 1:4 titer, TPPA + Treated w/ Bicillin x 2 weeks. Early syphilis.

## 2024-02-26 NOTE — Progress Notes (Signed)
 Will need to report this one to health department just in case.  RPR is negative but Treponemal antibodies present, suspect related to her past infection years ago when she was treated but could also be a current early infection.:(

## 2024-02-26 NOTE — Telephone Encounter (Signed)
 Spoke to patient on telephone.  RPR is NR and antibodies reactive.  Suspect this is coming from her past infection, but will report to health department.  She is aware of this and that health department may reach out to her.

## 2024-02-29 NOTE — Telephone Encounter (Addendum)
 Spoke Christina Cochran, Christina Cochran, re 02/25/24 syphilis TR. They will not be following based on 02/25/24 testing with PCP and her hx in 2000 (previous entry below); Closing event. RPR= Non Reactive TP= Positive  Christina Cochran will call pt with instructions to discuss hx and instructions not to donate plasma or blood in the futture, future testing recommendations (at least yearly- more often depending on number of partners); DIS counseling.

## 2024-05-13 ENCOUNTER — Ambulatory Visit (INDEPENDENT_AMBULATORY_CARE_PROVIDER_SITE_OTHER): Payer: MEDICAID | Admitting: Nurse Practitioner

## 2024-05-13 ENCOUNTER — Encounter: Payer: Self-pay | Admitting: Nurse Practitioner

## 2024-05-13 DIAGNOSIS — Z23 Encounter for immunization: Secondary | ICD-10-CM

## 2024-05-13 DIAGNOSIS — E785 Hyperlipidemia, unspecified: Secondary | ICD-10-CM

## 2024-05-13 DIAGNOSIS — M25512 Pain in left shoulder: Secondary | ICD-10-CM | POA: Diagnosis not present

## 2024-05-13 DIAGNOSIS — E1169 Type 2 diabetes mellitus with other specified complication: Secondary | ICD-10-CM | POA: Diagnosis not present

## 2024-05-13 MED ORDER — NYSTATIN 100000 UNIT/GM EX CREA: 1.0000 | TOPICAL_CREAM | Freq: Two times a day (BID) | CUTANEOUS | 1 refills | Status: AC

## 2024-05-13 MED ORDER — LISINOPRIL 2.5 MG PO TABS: 2.5000 mg | ORAL_TABLET | Freq: Every day | ORAL | 1 refills | Status: AC

## 2024-05-13 MED ORDER — ROSUVASTATIN CALCIUM 10 MG PO TABS: 10.0000 mg | ORAL_TABLET | Freq: Every day | ORAL | 1 refills | Status: AC

## 2024-05-13 NOTE — Progress Notes (Signed)
 BP 133/85   Pulse 66   Temp 98.5 F (36.9 C) (Oral)   Ht 5' 6 (1.676 m)   Wt 227 lb 6.4 oz (103.1 kg)   SpO2 97%   BMI 36.70 kg/m    Subjective:    Patient ID: Christina Cochran, female    DOB: 02-02-1981, 43 y.o.   MRN: [id number]  HPI: Christina Cochran is a 43 y.o. female  Chief Complaint  Patient presents with   Diabetes   Hyperlipidemia   Hypertension   Vaginal Itching   Shoulder Pain    Left. Possible injury from lifting boxes at work a month ago    DIABETES She is not on anything for diabetes control.  A1c was last 6.5%.  Doing well with Lisinopril  and Rosuvastatin , Hypoglycemic episodes:no Polydipsia/polyuria: no Visual disturbance: no Chest pain: no Paresthesias: no Glucose Monitoring: no  Accucheck frequency: Not Checking  Fasting glucose:  Post prandial:  Evening:  Before meals: Taking Insulin?: no  Long acting insulin:  Short acting insulin: Blood Pressure Monitoring: not checking Retinal Examination: Not up to Date Foot Exam: Up to Date Diabetic Education: Not Completed Pneumovax: Up to Date Influenza: Not up to Date Aspirin: no  HYPERLIPIDEMIA Hyperlipidemia status: excellent compliance Satisfied with current treatment?  yes Side effects:  no Medication compliance: excellent compliance Past cholesterol meds: rosuvastatin  (crestor ) Supplements: none Aspirin:  no The 10-year ASCVD risk score (Arnett DK, et al., 2019) is: 4.6%   Values used to calculate the score:     Age: 88 years     Clincally relevant sex: Female     Is Non-Hispanic African American: Yes     Diabetic: Yes     Tobacco smoker: Yes     Systolic Blood Pressure: 133 mmHg     Is BP treated: No     HDL Cholesterol: 62 mg/dL     Total Cholesterol: 219 mg/dL Chest pain:  no Coronary artery disease:  no Family history CAD:  no Family history early CAD:  no  Patient states she is having some vaginal itching in the crease.  It is raw and itchy where her panty line is.     Patient states she is having left shoulder pain.  She does have to lift about 30 lbs at work.  About a week ago it started hurting.  More painful when she tried to lift her arm all the way up.     Relevant past medical, surgical, family and social history reviewed and updated as indicated. Interim medical history since our last visit reviewed. Allergies and medications reviewed and updated.  Review of Systems  Eyes:  Negative for visual disturbance.  Respiratory:  Negative for cough, chest tightness and shortness of breath.   Cardiovascular:  Negative for chest pain, palpitations and leg swelling.  Neurological:  Negative for dizziness and headaches.    Per HPI unless specifically indicated above     Objective:    BP 133/85   Pulse 66   Temp 98.5 F (36.9 C) (Oral)   Ht 5' 6 (1.676 m)   Wt 227 lb 6.4 oz (103.1 kg)   SpO2 97%   BMI 36.70 kg/m   Wt Readings from Last 3 Encounters:  05/13/24 227 lb 6.4 oz (103.1 kg)  02/25/24 229 lb 9.6 oz (104.1 kg)  01/18/24 228 lb (103.4 kg)    Physical Exam Vitals and nursing note reviewed.  Constitutional:      General: She is not in acute  distress.    Appearance: Normal appearance. She is obese. She is not ill-appearing, toxic-appearing or diaphoretic.  HENT:     Head: Normocephalic.     Right Ear: External ear normal.     Left Ear: External ear normal.     Nose: Nose normal.     Mouth/Throat:     Mouth: Mucous membranes are moist.     Pharynx: Oropharynx is clear.  Eyes:     General:        Right eye: No discharge.        Left eye: No discharge.     Extraocular Movements: Extraocular movements intact.     Conjunctiva/sclera: Conjunctivae normal.     Pupils: Pupils are equal, round, and reactive to light.  Cardiovascular:     Rate and Rhythm: Normal rate and regular rhythm.     Heart sounds: No murmur heard. Pulmonary:     Effort: Pulmonary effort is normal. No respiratory distress.     Breath sounds: Normal breath  sounds. No wheezing or rales.  Musculoskeletal:     Cervical back: Normal range of motion and neck supple.  Skin:    General: Skin is warm and dry.     Capillary Refill: Capillary refill takes less than 2 seconds.  Neurological:     General: No focal deficit present.     Mental Status: She is alert and oriented to person, place, and time. Mental status is at baseline.  Psychiatric:        Mood and Affect: Mood normal.        Behavior: Behavior normal.        Thought Content: Thought content normal.        Judgment: Judgment normal.     Results for orders placed or performed in visit on 2024-03-24  WET PREP FOR TRICH, YEAST, CLUE   Collection Time: 2024/03/24 10:13 AM   Specimen: Sterile Swab   Sterile Swab  Result Value Ref Range   Trichomonas Exam Negative Negative   Yeast Exam Negative Negative   Clue Cell Exam Negative Negative  Treponemal Antibodies, TPPA   Collection Time: Mar 24, 2024 10:14 AM  Result Value Ref Range   Treponemal Antibodies, TPPA Reactive (A) Non Reactive   Interpretation: Comment   RPR w/reflex to TrepSure   Collection Time: Mar 24, 2024 10:14 AM  Result Value Ref Range   RPR Non Reactive Non Reactive      Assessment & Plan:   Problem List Items Addressed This Visit       Endocrine   Type 2 diabetes mellitus with other specified complication, without long-term current use of insulin (HCC)   Chronic.  Well controlled with diet.  Last A1c was 6.5%. Will continue without medication at this time.  Continue with ACE and Statin.  Discussed possibly starting Ozempic with patient during visit.  Discussed side effects and benefits of both medications with patient during visit.  Patient will decide once her A1c returns.  Discussed eye exam during visit today.  Follow up in 6 months.  Call sooner if concerns arise.       Relevant Medications   rosuvastatin  (CRESTOR ) 10 MG tablet   lisinopril  (ZESTRIL ) 2.5 MG tablet   Other Relevant Orders   Comprehensive metabolic  panel with GFR   Hemoglobin A1c   Pneumococcal conjugate vaccine 20-valent (Prevnar 20)   Hyperlipidemia associated with type 2 diabetes mellitus (HCC)   Chronic.  Controlled. Increased dose of rosuvastatin  to 10mg  daily.  Labs ordered today.  Return to clinic in 6 months for reevaluation.  Call sooner if concerns arise.        Relevant Medications   rosuvastatin  (CRESTOR ) 10 MG tablet   lisinopril  (ZESTRIL ) 2.5 MG tablet   Other Relevant Orders   Lipid panel     Other   Obesity, morbid (HCC)   Recommended eating smaller high protein, low fat meals more frequently and exercising 30 mins a day 5 times a week with a goal of 10-15lb weight loss in the next 3 months. Would benefit from GLP1 therapy.  Discussed at visit today.       Other Visit Diagnoses       Acute pain of left shoulder       Relevant Orders   DG Shoulder Left        Follow up plan: Return in about 6 months (around 11/13/2024) for Physical and Fasting labs.

## 2024-05-13 NOTE — Assessment & Plan Note (Signed)
 Chronic.  Well controlled with diet.  Last A1c was 6.5%. Will continue without medication at this time.  Continue with ACE and Statin.  Discussed possibly starting Ozempic with patient during visit.  Discussed side effects and benefits of both medications with patient during visit.  Patient will decide once her A1c returns.  Discussed eye exam during visit today.  Follow up in 6 months.  Call sooner if concerns arise.

## 2024-05-13 NOTE — Assessment & Plan Note (Signed)
 Chronic.  Controlled. Increased dose of rosuvastatin  to 10mg  daily.  Labs ordered today.  Return to clinic in 6 months for reevaluation.  Call sooner if concerns arise.

## 2024-05-13 NOTE — Assessment & Plan Note (Signed)
 Recommended eating smaller high protein, low fat meals more frequently and exercising 30 mins a day 5 times a week with a goal of 10-15lb weight loss in the next 3 months. Would benefit from GLP1 therapy.  Discussed at visit today.

## 2024-05-17 ENCOUNTER — Ambulatory Visit
Admission: RE | Admit: 2024-05-17 | Discharge: 2024-05-17 | Disposition: A | Discharge status: Home / Self Care | Payer: MEDICAID | Source: Ambulatory Visit | Attending: Nurse Practitioner | Admitting: Nurse Practitioner

## 2024-05-17 DIAGNOSIS — Z1231 Encounter for screening mammogram for malignant neoplasm of breast: Secondary | ICD-10-CM | POA: Insufficient documentation

## 2024-05-17 DIAGNOSIS — M25512 Pain in left shoulder: Secondary | ICD-10-CM | POA: Insufficient documentation

## 2024-05-18 ENCOUNTER — Ambulatory Visit: Payer: Self-pay | Admitting: Nurse Practitioner

## 2024-05-18 DIAGNOSIS — M25512 Pain in left shoulder: Secondary | ICD-10-CM

## 2024-06-01 ENCOUNTER — Ambulatory Visit (INDEPENDENT_AMBULATORY_CARE_PROVIDER_SITE_OTHER): Payer: MEDICAID | Admitting: Orthopedic Surgery

## 2024-06-01 VITALS — BP 133/83 | Ht 67.0 in | Wt 226.0 lb

## 2024-06-01 DIAGNOSIS — M25512 Pain in left shoulder: Secondary | ICD-10-CM

## 2024-06-01 MED ORDER — DICLOFENAC SODIUM 50 MG PO TBEC: 50.0000 mg | DELAYED_RELEASE_TABLET | Freq: Two times a day (BID) | ORAL | 0 refills | Status: AC

## 2024-06-01 NOTE — Progress Notes (Unsigned)
 New Patient Visit  Assessment: Christina Cochran is a 43 y.o. female with the following: Acute left shoulder pain.    Plan: Christina Cochran has had pain in the anterior aspect of the left shoulder.  Radiographs were available in clinic today.  No acute injuries.  She has good range of motion, as well as excellent strength of the left shoulder.  She is most likely cause some irritation with a relative increase in activity recently.  We discussed multiple treatment options.  She is elected proceed with some prescription NSAIDs.  I provided her with a prescription in clinic today.  If she continues to have issues, would recommend formal physical therapy.  No restrictions in her activity.  Follow-up as needed.  Follow-up: Return if symptoms worsen or fail to improve.  Subjective:  Chief Complaint  Patient presents with   Shoulder Pain    Left shoulder pain  she fell years ago getting out of the shower and been having pain since   she lifts boxes at work  She also has pain in the right shoulder     History of Present Illness: Christina Cochran is a 43 y.o. female who has been referred by Darice Petty, NP for evaluation of left shoulder pain.  She states that she has had pain in her left shoulder before.  A couple years ago she fell, and injured the left shoulder.  However, the pain gradually resolved.  Currently, she has been having pain in the anterior and superior aspect of the left shoulder for the past 3-4 weeks.  She describes starting a new job 2-3 months ago.  No specific injuries.  She does a lot of lifting at work.  This may have caused her pain.  She is not taking medicines on a regular basis.  She has not worked with therapy.  No prior injections.   Review of Systems: No fevers or chills No numbness or tingling No chest pain No shortness of breath No bowel or bladder dysfunction No GI distress No headaches   Medical History:  Past Medical History:  Diagnosis Date    Diabetes mellitus without complication (HCC)     Past Surgical History:  Procedure Laterality Date   CESAREAN SECTION     times two    TUBAL LIGATION      Family History  Problem Relation Age of Onset   Diabetes Paternal Grandmother    Breast cancer Neg Hx    Social History   Tobacco Use   Smoking status: Some Days    Current packs/day: 0.50    Average packs/day: 0.5 packs/day for 24.6 years (12.3 ttl pk-yrs)    Types: Cigarettes    Start date: 2001   Smokeless tobacco: Never  Vaping Use   Vaping status: Former  Substance Use Topics   Alcohol use: Not Currently   Drug use: Not Currently    Types: Cocaine, Marijuana, Crack cocaine    Comment: no use since 08/24/2021    No Known Allergies  Current Meds  Medication Sig   cariprazine (VRAYLAR) 1.5 MG capsule Take by mouth. Take one tablet by mouth daily for mood   diclofenac  (VOLTAREN ) 50 MG EC tablet Take 1 tablet (50 mg total) by mouth 2 (two) times daily.   lisinopril  (ZESTRIL ) 2.5 MG tablet Take 1 tablet (2.5 mg total) by mouth daily.   naltrexone (DEPADE) 50 MG tablet Take by mouth daily. Take one tablet by mouth daily at the same time everyday  nystatin  cream (MYCOSTATIN ) Apply 1 Application topically 2 (two) times daily.   rosuvastatin  (CRESTOR ) 10 MG tablet Take 1 tablet (10 mg total) by mouth daily.   traZODone (DESYREL) 100 MG tablet Take 100 mg by mouth at bedtime. Take one tablet by mouth at bedtime as needed for sleep    Objective: BP 133/83   Ht 5' 7 (1.702 m)   Wt 226 lb (102.5 kg)   LMP 05/03/2024 (Approximate)   BMI 35.40 kg/m   Physical Exam:  General: Alert and oriented. and No acute distress. Gait: Normal gait.  Left shoulder without deformity.  Full forward flexion.  Abduction of 100 degrees.  Internal rotation to lumbar spine.  5/5 strength throughout the left shoulder.  Mild tenderness to palpation over the anterior shoulder, as well as the bicipital groove.  Mild irritation of the  right shoulder, but she has excellent range of motion and good strength in right shoulder as well.  IMAGING: I personally reviewed images previously obtained in clinic   X-rays of the left shoulder were available in clinic today.  Negative for acute injury.   New Medications:  Meds ordered this encounter  Medications   diclofenac  (VOLTAREN ) 50 MG EC tablet    Sig: Take 1 tablet (50 mg total) by mouth 2 (two) times daily.    Dispense:  60 tablet    Refill:  0      Oneil DELENA Horde, MD  06/02/2024 12:27 PM

## 2024-06-02 ENCOUNTER — Encounter: Payer: Self-pay | Admitting: Orthopedic Surgery

## 2024-09-05 ENCOUNTER — Encounter: Payer: Self-pay | Admitting: Radiology

## 2024-11-15 ENCOUNTER — Encounter: Payer: MEDICAID | Admitting: Nurse Practitioner

## 2024-11-15 NOTE — Progress Notes (Unsigned)
 "  There were no vitals taken for this visit.   Subjective:    Patient ID: Christina Cochran, female    DOB: October 17, 1981, 44 y.o.   MRN: [id number]  HPI: Christina Cochran is a 44 y.o. female presenting on 11/15/2024 for comprehensive medical examination. Current medical complaints include:{Blank single:19197::none,***}  She currently lives with: Menopausal Symptoms: {Blank single:19197::yes,no}  DIABETES She is not on anything for diabetes control.  A1c was last 6.5%.  Doing well with Lisinopril  and Rosuvastatin , Hypoglycemic episodes:no Polydipsia/polyuria: no Visual disturbance: no Chest pain: no Paresthesias: no Glucose Monitoring: no  Accucheck frequency: Not Checking  Fasting glucose:  Post prandial:  Evening:  Before meals: Taking Insulin?: no  Long acting insulin:  Short acting insulin: Blood Pressure Monitoring: not checking Retinal Examination: Not up to Date Foot Exam: Up to Date Diabetic Education: Not Completed Pneumovax: Up to Date Influenza: Not up to Date Aspirin: no  HYPERLIPIDEMIA Hyperlipidemia status: excellent compliance Satisfied with current treatment?  yes Side effects:  no Medication compliance: excellent compliance Past cholesterol meds: rosuvastatin  (crestor ) Supplements: none Aspirin:  no The 10-year ASCVD risk score (Arnett DK, et al., 2019) is: 4.6%   Values used to calculate the score:     Age: 38 years     Clincally relevant sex: Female     Is Non-Hispanic African American: Yes     Diabetic: Yes     Tobacco smoker: Yes     Systolic Blood Pressure: 133 mmHg     Is BP treated: No     HDL Cholesterol: 62 mg/dL     Total Cholesterol: 219 mg/dL Chest pain:  no Coronary artery disease:  no Family history CAD:  no Family history early CAD:  no  Depression Screen done today and results listed below:     05/13/2024    8:50 AM 01/18/2024    3:44 PM 11/11/2023   10:39 AM 11/10/2023   10:49 AM 09/11/2023   11:02 AM  Depression screen  PHQ 2/9  Decreased Interest 0 0 0 0 2  Down, Depressed, Hopeless 0 0 0 0 1  PHQ - 2 Score 0 0 0 0 3  Altered sleeping 0 0 0 0 3  Tired, decreased energy 0 0 0 0 2  Change in appetite 0 0 0 0   Feeling bad or failure about yourself  0 0 0 0 1  Trouble concentrating 0 0 0 0 1  Moving slowly or fidgety/restless 0 0 0 0 0  Suicidal thoughts 0 0 0 0 0  PHQ-9 Score 0  0  0  0  10   Difficult doing work/chores  Not difficult at all        Data saved with a previous flowsheet row definition    The patient {has/does not yjcz:80150} a history of falls. I {did/did not:19850} complete a risk assessment for falls. A plan of care for falls {was/was not:19852} documented.   Past Medical History:  Past Medical History:  Diagnosis Date   Diabetes mellitus without complication (HCC)     Surgical History:  Past Surgical History:  Procedure Laterality Date   CESAREAN SECTION     times two    TUBAL LIGATION      Medications:  Current Outpatient Medications on File Prior to Visit  Medication Sig   cariprazine (VRAYLAR) 1.5 MG capsule Take by mouth. Take one tablet by mouth daily for mood   diclofenac  (VOLTAREN ) 50 MG EC tablet Take 1 tablet (50  mg total) by mouth 2 (two) times daily.   lisinopril  (ZESTRIL ) 2.5 MG tablet Take 1 tablet (2.5 mg total) by mouth daily.   naltrexone (DEPADE) 50 MG tablet Take by mouth daily. Take one tablet by mouth daily at the same time everyday   nystatin  cream (MYCOSTATIN ) Apply 1 Application topically 2 (two) times daily.   rosuvastatin  (CRESTOR ) 10 MG tablet Take 1 tablet (10 mg total) by mouth daily.   traZODone (DESYREL) 100 MG tablet Take 100 mg by mouth at bedtime. Take one tablet by mouth at bedtime as needed for sleep   No current facility-administered medications on file prior to visit.    Allergies:  Allergies[1]  Social History:  Social History   Socioeconomic History   Marital status: Single    Spouse name: Not on file   Number of  children: Not on file   Years of education: Not on file   Highest education level: Not on file  Occupational History   Not on file  Tobacco Use   Smoking status: Some Days    Current packs/day: 0.50    Average packs/day: 0.5 packs/day for 25.0 years (12.5 ttl pk-yrs)    Types: Cigarettes    Start date: 2001   Smokeless tobacco: Never  Vaping Use   Vaping status: Former  Substance and Sexual Activity   Alcohol use: Not Currently   Drug use: Not Currently    Types: Cocaine, Marijuana, Crack cocaine    Comment: no use since 08/24/2021   Sexual activity: Yes    Birth control/protection: Surgical  Other Topics Concern   Not on file  Social History Narrative   Not on file   Social Drivers of Health   Tobacco Use: High Risk (06/02/2024)   Patient History    Smoking Tobacco Use: Some Days    Smokeless Tobacco Use: Never    Passive Exposure: Not on file  Financial Resource Strain: Low Risk (09/11/2023)   Overall Financial Resource Strain (CARDIA)    Difficulty of Paying Living Expenses: Not hard at all  Food Insecurity: No Food Insecurity (09/11/2023)   Hunger Vital Sign    Worried About Running Out of Food in the Last Year: Never true    Ran Out of Food in the Last Year: Never true  Transportation Needs: No Transportation Needs (09/11/2023)   PRAPARE - Administrator, Civil Service (Medical): No    Lack of Transportation (Non-Medical): No  Physical Activity: Inactive (09/11/2023)   Exercise Vital Sign    Days of Exercise per Week: 0 days    Minutes of Exercise per Session: 0 min  Stress: No Stress Concern Present (09/11/2023)   [employer] of Occupational Health - Occupational Stress Questionnaire    Feeling of Stress : Only a little  Social Connections: Moderately Integrated (09/11/2023)   Social Connection and Isolation Panel    Frequency of Communication with Friends and Family: Twice a week    Frequency of Social Gatherings with Friends and Family: Once a  week    Attends Religious Services: More than 4 times per year    Active Member of [employer] or Organizations: Yes    Attends Banker Meetings: More than 4 times per year    Marital Status: Divorced  Intimate Partner Violence: Not At Risk (05/20/2022)   Humiliation, Afraid, Rape, and Kick questionnaire    Fear of Current or Ex-Partner: No    Emotionally Abused: No    Physically Abused: No  Sexually Abused: No  Depression (PHQ2-9): Low Risk (05/13/2024)   Depression (PHQ2-9)    PHQ-2 Score: 0  Alcohol Screen: Not on file  Housing: Low Risk (09/11/2023)   Housing    Last Housing Risk Score: 0  Utilities: Not At Risk (09/11/2023)   AHC Utilities    Threatened with loss of utilities: No  Health Literacy: Adequate Health Literacy (09/11/2023)   B1300 Health Literacy    Frequency of need for help with medical instructions: Never   Tobacco Use History[2] Social History   Substance and Sexual Activity  Alcohol Use Not Currently    Family History:  Family History  Problem Relation Age of Onset   Diabetes Paternal Grandmother    Breast cancer Neg Hx     Past medical history, surgical history, medications, allergies, family history and social history reviewed with patient today and changes made to appropriate areas of the chart.   ROS All other ROS negative except what is listed above and in the HPI.      Objective:    There were no vitals taken for this visit.  Wt Readings from Last 3 Encounters:  06/01/24 226 lb (102.5 kg)  05/13/24 227 lb 6.4 oz (103.1 kg)  02/25/24 229 lb 9.6 oz (104.1 kg)    Physical Exam  Results for orders placed or performed in visit on 02/25/24  WET PREP FOR TRICH, YEAST, CLUE   Collection Time: 02/25/24 10:13 AM   Specimen: Sterile Swab   Sterile Swab  Result Value Ref Range   Trichomonas Exam Negative Negative   Yeast Exam Negative Negative   Clue Cell Exam Negative Negative  Treponemal Antibodies, TPPA   Collection Time:  02/25/24 10:14 AM  Result Value Ref Range   Treponemal Antibodies, TPPA Reactive (A) Non Reactive   Interpretation: Comment   RPR w/reflex to TrepSure   Collection Time: 02/25/24 10:14 AM  Result Value Ref Range   RPR Non Reactive Non Reactive      Assessment & Plan:   Problem List Items Addressed This Visit       Endocrine   Type 2 diabetes mellitus with other specified complication, without long-term current use of insulin (HCC) - Primary   Hyperlipidemia associated with type 2 diabetes mellitus (HCC)     Other   Obesity, morbid (HCC)     Follow up plan: No follow-ups on file.   LABORATORY TESTING:  - Pap smear: {Blank single:19197::pap done,not applicable,up to date,done elsewhere}  IMMUNIZATIONS:   - Tdap: Tetanus vaccination status reviewed: {tetanus status:315746}. - Influenza: {Blank single:19197::Up to date,Administered today,Postponed to flu season,Refused,Given elsewhere} - Pneumovax: {Blank single:19197::Up to date,Administered today,Not applicable,Refused,Given elsewhere} - Prevnar: {Blank single:19197::Up to date,Administered today,Not applicable,Refused,Given elsewhere} - COVID: {Blank single:19197::Up to date,Administered today,Not applicable,Refused,Given elsewhere} - HPV: {Blank single:19197::Up to date,Administered today,Not applicable,Refused,Given elsewhere} - Shingrix vaccine: {Blank single:19197::Up to date,Administered today,Not applicable,Refused,Given elsewhere}  SCREENING: -Mammogram: {Blank single:19197::Up to date,Ordered today,Not applicable,Refused,Done elsewhere}  - Colonoscopy: {Blank single:19197::Up to date,Ordered today,Not applicable,Refused,Done elsewhere}  - Bone Density: {Blank single:19197::Up to date,Ordered today,Not applicable,Refused,Done elsewhere}  -Hearing Test: {Blank single:19197::Up to date,Ordered today,Not  applicable,Refused,Done elsewhere}  -Spirometry: {Blank single:19197::Up to date,Ordered today,Not applicable,Refused,Done elsewhere}   PATIENT COUNSELING:   Advised to take 1 mg of folate supplement per day if capable of pregnancy.   Sexuality: Discussed sexually transmitted diseases, partner selection, use of condoms, avoidance of unintended pregnancy  and contraceptive alternatives.   Advised to avoid cigarette smoking.  I discussed with the patient that most  people either abstain from alcohol or drink within safe limits (<=14/week and <=4 drinks/occasion for males, <=7/weeks and <= 3 drinks/occasion for females) and that the risk for alcohol disorders and other health effects rises proportionally with the number of drinks per week and how often a drinker exceeds daily limits.  Discussed cessation/primary prevention of drug use and availability of treatment for abuse.   Diet: Encouraged to adjust caloric intake to maintain  or achieve ideal body weight, to reduce intake of dietary saturated fat and total fat, to limit sodium intake by avoiding high sodium foods and not adding table salt, and to maintain adequate dietary potassium and calcium  preferably from fresh fruits, vegetables, and low-fat dairy products.    stressed the importance of regular exercise  Injury prevention: Discussed safety belts, safety helmets, smoke detector, smoking near bedding or upholstery.   Dental health: Discussed importance of regular tooth brushing, flossing, and dental visits.    NEXT PREVENTATIVE PHYSICAL DUE IN 1 YEAR. No follow-ups on file.             [1] No Known Allergies [2]  Social History Tobacco Use  Smoking Status Some Days   Current packs/day: 0.50   Average packs/day: 0.5 packs/day for 25.0 years (12.5 ttl pk-yrs)   Types: Cigarettes   Start date: 2001  Smokeless Tobacco Never   "

## 2024-11-22 ENCOUNTER — Encounter: Payer: MEDICAID | Admitting: Nurse Practitioner

## 2024-11-22 NOTE — Progress Notes (Unsigned)
 "  There were no vitals taken for this visit.   Subjective:    Patient ID: Christina Cochran, female    DOB: June 04, 1981, 44 y.o.   MRN: [id number]  HPI: Christina Cochran is a 44 y.o. female presenting on 11/22/2024 for comprehensive medical examination. Current medical complaints include:{Blank single:19197::none,***}  She currently lives with: Menopausal Symptoms: {Blank single:19197::yes,no}  DIABETES She is not on anything for diabetes control.  A1c was last 6.5%.  Doing well with Lisinopril  and Rosuvastatin , Hypoglycemic episodes:no Polydipsia/polyuria: no Visual disturbance: no Chest pain: no Paresthesias: no Glucose Monitoring: no  Accucheck frequency: Not Checking  Fasting glucose:  Post prandial:  Evening:  Before meals: Taking Insulin?: no  Long acting insulin:  Short acting insulin: Blood Pressure Monitoring: not checking Retinal Examination: Not up to Date Foot Exam: Up to Date Diabetic Education: Not Completed Pneumovax: Up to Date Influenza: Not up to Date Aspirin: no  HYPERLIPIDEMIA Hyperlipidemia status: excellent compliance Satisfied with current treatment?  yes Side effects:  no Medication compliance: excellent compliance Past cholesterol meds: rosuvastatin  (crestor ) Supplements: none Aspirin:  no The 10-year ASCVD risk score (Arnett DK, et al., 2019) is: 4.6%   Values used to calculate the score:     Age: 44 years     Clincally relevant sex: Female     Is Non-Hispanic African American: Yes     Diabetic: Yes     Tobacco smoker: Yes     Systolic Blood Pressure: 133 mmHg     Is BP treated: No     HDL Cholesterol: 62 mg/dL     Total Cholesterol: 219 mg/dL Chest pain:  no Coronary artery disease:  no Family history CAD:  no Family history early CAD:  no  Depression Screen done today and results listed below:     05/13/2024    8:50 AM 01/18/2024    3:44 PM 11/11/2023   10:39 AM 11/10/2023   10:49 AM 09/11/2023   11:02 AM  Depression screen  PHQ 2/9  Decreased Interest 0 0 0 0 2  Down, Depressed, Hopeless 0 0 0 0 1  PHQ - 2 Score 0 0 0 0 3  Altered sleeping 0 0 0 0 3  Tired, decreased energy 0 0 0 0 2  Change in appetite 0 0 0 0   Feeling bad or failure about yourself  0 0 0 0 1  Trouble concentrating 0 0 0 0 1  Moving slowly or fidgety/restless 0 0 0 0 0  Suicidal thoughts 0 0 0 0 0  PHQ-9 Score 0  0  0  0  10   Difficult doing work/chores  Not difficult at all        Data saved with a previous flowsheet row definition    The patient {has/does not yjcz:80150} a history of falls. I {did/did not:19850} complete a risk assessment for falls. A plan of care for falls {was/was not:19852} documented.   Past Medical History:  Past Medical History:  Diagnosis Date   Diabetes mellitus without complication (HCC)     Surgical History:  Past Surgical History:  Procedure Laterality Date   CESAREAN SECTION     times two    TUBAL LIGATION      Medications:  Current Outpatient Medications on File Prior to Visit  Medication Sig   cariprazine (VRAYLAR) 1.5 MG capsule Take by mouth. Take one tablet by mouth daily for mood   diclofenac  (VOLTAREN ) 50 MG EC tablet Take 1 tablet (50  mg total) by mouth 2 (two) times daily.   lisinopril  (ZESTRIL ) 2.5 MG tablet Take 1 tablet (2.5 mg total) by mouth daily.   naltrexone (DEPADE) 50 MG tablet Take by mouth daily. Take one tablet by mouth daily at the same time everyday   nystatin  cream (MYCOSTATIN ) Apply 1 Application topically 2 (two) times daily.   rosuvastatin  (CRESTOR ) 10 MG tablet Take 1 tablet (10 mg total) by mouth daily.   traZODone (DESYREL) 100 MG tablet Take 100 mg by mouth at bedtime. Take one tablet by mouth at bedtime as needed for sleep   No current facility-administered medications on file prior to visit.    Allergies:  Allergies[1]  Social History:  Social History   Socioeconomic History   Marital status: Single    Spouse name: Not on file   Number of  children: Not on file   Years of education: Not on file   Highest education level: Not on file  Occupational History   Not on file  Tobacco Use   Smoking status: Some Days    Current packs/day: 0.50    Average packs/day: 0.5 packs/day for 25.1 years (12.5 ttl pk-yrs)    Types: Cigarettes    Start date: 2001   Smokeless tobacco: Never  Vaping Use   Vaping status: Former  Substance and Sexual Activity   Alcohol use: Not Currently   Drug use: Not Currently    Types: Cocaine, Marijuana, Crack cocaine    Comment: no use since 08/24/2021   Sexual activity: Yes    Birth control/protection: Surgical  Other Topics Concern   Not on file  Social History Narrative   Not on file   Social Drivers of Health   Tobacco Use: High Risk (06/02/2024)   Patient History    Smoking Tobacco Use: Some Days    Smokeless Tobacco Use: Never    Passive Exposure: Not on file  Financial Resource Strain: Low Risk (09/11/2023)   Overall Financial Resource Strain (CARDIA)    Difficulty of Paying Living Expenses: Not hard at all  Food Insecurity: No Food Insecurity (09/11/2023)   Hunger Vital Sign    Worried About Running Out of Food in the Last Year: Never true    Ran Out of Food in the Last Year: Never true  Transportation Needs: No Transportation Needs (09/11/2023)   PRAPARE - Administrator, Civil Service (Medical): No    Lack of Transportation (Non-Medical): No  Physical Activity: Inactive (09/11/2023)   Exercise Vital Sign    Days of Exercise per Week: 0 days    Minutes of Exercise per Session: 0 min  Stress: No Stress Concern Present (09/11/2023)   [employer] of Occupational Health - Occupational Stress Questionnaire    Feeling of Stress : Only a little  Social Connections: Moderately Integrated (09/11/2023)   Social Connection and Isolation Panel    Frequency of Communication with Friends and Family: Twice a week    Frequency of Social Gatherings with Friends and Family: Once a  week    Attends Religious Services: More than 4 times per year    Active Member of [employer] or Organizations: Yes    Attends Banker Meetings: More than 4 times per year    Marital Status: Divorced  Intimate Partner Violence: Not At Risk (05/20/2022)   Humiliation, Afraid, Rape, and Kick questionnaire    Fear of Current or Ex-Partner: No    Emotionally Abused: No    Physically Abused: No  Sexually Abused: No  Depression (PHQ2-9): Low Risk (05/13/2024)   Depression (PHQ2-9)    PHQ-2 Score: 0  Alcohol Screen: Not on file  Housing: Low Risk (09/11/2023)   Housing    Last Housing Risk Score: 0  Utilities: Not At Risk (09/11/2023)   AHC Utilities    Threatened with loss of utilities: No  Health Literacy: Adequate Health Literacy (09/11/2023)   B1300 Health Literacy    Frequency of need for help with medical instructions: Never   Tobacco Use History[2] Social History   Substance and Sexual Activity  Alcohol Use Not Currently    Family History:  Family History  Problem Relation Age of Onset   Diabetes Paternal Grandmother    Breast cancer Neg Hx     Past medical history, surgical history, medications, allergies, family history and social history reviewed with patient today and changes made to appropriate areas of the chart.   ROS All other ROS negative except what is listed above and in the HPI.      Objective:    There were no vitals taken for this visit.  Wt Readings from Last 3 Encounters:  06/01/24 226 lb (102.5 kg)  05/13/24 227 lb 6.4 oz (103.1 kg)  02/25/24 229 lb 9.6 oz (104.1 kg)    Physical Exam  Results for orders placed or performed in visit on 02/25/24  WET PREP FOR TRICH, YEAST, CLUE   Collection Time: 02/25/24 10:13 AM   Specimen: Sterile Swab   Sterile Swab  Result Value Ref Range   Trichomonas Exam Negative Negative   Yeast Exam Negative Negative   Clue Cell Exam Negative Negative  Treponemal Antibodies, TPPA   Collection Time:  02/25/24 10:14 AM  Result Value Ref Range   Treponemal Antibodies, TPPA Reactive (A) Non Reactive   Interpretation: Comment   RPR w/reflex to TrepSure   Collection Time: 02/25/24 10:14 AM  Result Value Ref Range   RPR Non Reactive Non Reactive      Assessment & Plan:   Problem List Items Addressed This Visit   None     Follow up plan: No follow-ups on file.   LABORATORY TESTING:  - Pap smear: {Blank single:19197::pap done,not applicable,up to date,done elsewhere}  IMMUNIZATIONS:   - Tdap: Tetanus vaccination status reviewed: {tetanus status:315746}. - Influenza: {Blank single:19197::Up to date,Administered today,Postponed to flu season,Refused,Given elsewhere} - Pneumovax: {Blank single:19197::Up to date,Administered today,Not applicable,Refused,Given elsewhere} - Prevnar: {Blank single:19197::Up to date,Administered today,Not applicable,Refused,Given elsewhere} - COVID: {Blank single:19197::Up to date,Administered today,Not applicable,Refused,Given elsewhere} - HPV: {Blank single:19197::Up to date,Administered today,Not applicable,Refused,Given elsewhere} - Shingrix vaccine: {Blank single:19197::Up to date,Administered today,Not applicable,Refused,Given elsewhere}  SCREENING: -Mammogram: {Blank single:19197::Up to date,Ordered today,Not applicable,Refused,Done elsewhere}  - Colonoscopy: {Blank single:19197::Up to date,Ordered today,Not applicable,Refused,Done elsewhere}  - Bone Density: {Blank single:19197::Up to date,Ordered today,Not applicable,Refused,Done elsewhere}  -Hearing Test: {Blank single:19197::Up to date,Ordered today,Not applicable,Refused,Done elsewhere}  -Spirometry: {Blank single:19197::Up to date,Ordered today,Not applicable,Refused,Done elsewhere}   PATIENT COUNSELING:   Advised to take 1 mg of folate supplement per day if capable of  pregnancy.   Sexuality: Discussed sexually transmitted diseases, partner selection, use of condoms, avoidance of unintended pregnancy  and contraceptive alternatives.   Advised to avoid cigarette smoking.  I discussed with the patient that most people either abstain from alcohol or drink within safe limits (<=14/week and <=4 drinks/occasion for males, <=7/weeks and <= 3 drinks/occasion for females) and that the risk for alcohol disorders and other health effects rises proportionally with the number of drinks per week  and how often a drinker exceeds daily limits.  Discussed cessation/primary prevention of drug use and availability of treatment for abuse.   Diet: Encouraged to adjust caloric intake to maintain  or achieve ideal body weight, to reduce intake of dietary saturated fat and total fat, to limit sodium intake by avoiding high sodium foods and not adding table salt, and to maintain adequate dietary potassium and calcium  preferably from fresh fruits, vegetables, and low-fat dairy products.    stressed the importance of regular exercise  Injury prevention: Discussed safety belts, safety helmets, smoke detector, smoking near bedding or upholstery.   Dental health: Discussed importance of regular tooth brushing, flossing, and dental visits.    NEXT PREVENTATIVE PHYSICAL DUE IN 1 YEAR. No follow-ups on file.              [1] No Known Allergies [2]  Social History Tobacco Use  Smoking Status Some Days   Current packs/day: 0.50   Average packs/day: 0.5 packs/day for 25.1 years (12.5 ttl pk-yrs)   Types: Cigarettes   Start date: 2001  Smokeless Tobacco Never   "
